# Patient Record
Sex: Female | Born: 1945 | Race: White | Hispanic: No | Marital: Married | Smoking: Former smoker
Health system: Southern US, Community
[De-identification: ages and names within clinical notes are randomized; demographics above are authoritative.]

## PROBLEM LIST (undated history)

## (undated) DIAGNOSIS — C801 Malignant (primary) neoplasm, unspecified: Secondary | ICD-10-CM

## (undated) DIAGNOSIS — M1711 Unilateral primary osteoarthritis, right knee: Secondary | ICD-10-CM

## (undated) DIAGNOSIS — T4145XA Adverse effect of unspecified anesthetic, initial encounter: Secondary | ICD-10-CM

## (undated) DIAGNOSIS — F419 Anxiety disorder, unspecified: Secondary | ICD-10-CM

## (undated) DIAGNOSIS — K219 Gastro-esophageal reflux disease without esophagitis: Secondary | ICD-10-CM

## (undated) DIAGNOSIS — M1990 Unspecified osteoarthritis, unspecified site: Secondary | ICD-10-CM

## (undated) DIAGNOSIS — Z22321 Carrier or suspected carrier of Methicillin susceptible Staphylococcus aureus: Secondary | ICD-10-CM

## (undated) DIAGNOSIS — IMO0001 Reserved for inherently not codable concepts without codable children: Secondary | ICD-10-CM

## (undated) DIAGNOSIS — I10 Essential (primary) hypertension: Secondary | ICD-10-CM

## (undated) DIAGNOSIS — D72829 Elevated white blood cell count, unspecified: Secondary | ICD-10-CM

## (undated) DIAGNOSIS — T8859XA Other complications of anesthesia, initial encounter: Secondary | ICD-10-CM

## (undated) HISTORY — PX: CHOLECYSTECTOMY: SHX55

## (undated) HISTORY — PX: BREAST SURGERY: SHX581

---

## 2014-01-16 ENCOUNTER — Emergency Department (HOSPITAL_BASED_OUTPATIENT_CLINIC_OR_DEPARTMENT_OTHER)
Admission: EM | Admit: 2014-01-16 | Discharge: 2014-01-16 | Disposition: A | Discharge status: Home / Self Care | Payer: Medicare Other | Attending: Emergency Medicine | Admitting: Emergency Medicine

## 2014-01-16 ENCOUNTER — Emergency Department (HOSPITAL_BASED_OUTPATIENT_CLINIC_OR_DEPARTMENT_OTHER): Payer: Medicare Other

## 2014-01-16 ENCOUNTER — Encounter (HOSPITAL_BASED_OUTPATIENT_CLINIC_OR_DEPARTMENT_OTHER): Payer: Self-pay | Admitting: Emergency Medicine

## 2014-01-16 DIAGNOSIS — Y929 Unspecified place or not applicable: Secondary | ICD-10-CM | POA: Diagnosis not present

## 2014-01-16 DIAGNOSIS — F411 Generalized anxiety disorder: Secondary | ICD-10-CM | POA: Insufficient documentation

## 2014-01-16 DIAGNOSIS — S46909A Unspecified injury of unspecified muscle, fascia and tendon at shoulder and upper arm level, unspecified arm, initial encounter: Secondary | ICD-10-CM | POA: Insufficient documentation

## 2014-01-16 DIAGNOSIS — Z79899 Other long term (current) drug therapy: Secondary | ICD-10-CM | POA: Insufficient documentation

## 2014-01-16 DIAGNOSIS — S4980XA Other specified injuries of shoulder and upper arm, unspecified arm, initial encounter: Secondary | ICD-10-CM | POA: Insufficient documentation

## 2014-01-16 DIAGNOSIS — W010XXA Fall on same level from slipping, tripping and stumbling without subsequent striking against object, initial encounter: Secondary | ICD-10-CM | POA: Diagnosis not present

## 2014-01-16 DIAGNOSIS — W1809XA Striking against other object with subsequent fall, initial encounter: Secondary | ICD-10-CM | POA: Diagnosis not present

## 2014-01-16 DIAGNOSIS — K219 Gastro-esophageal reflux disease without esophagitis: Secondary | ICD-10-CM | POA: Insufficient documentation

## 2014-01-16 DIAGNOSIS — Z880 Allergy status to penicillin: Secondary | ICD-10-CM | POA: Diagnosis not present

## 2014-01-16 DIAGNOSIS — S43004A Unspecified dislocation of right shoulder joint, initial encounter: Secondary | ICD-10-CM

## 2014-01-16 DIAGNOSIS — Y9389 Activity, other specified: Secondary | ICD-10-CM | POA: Insufficient documentation

## 2014-01-16 DIAGNOSIS — S43006A Unspecified dislocation of unspecified shoulder joint, initial encounter: Secondary | ICD-10-CM | POA: Insufficient documentation

## 2014-01-16 HISTORY — DX: Anxiety disorder, unspecified: F41.9

## 2014-01-16 HISTORY — DX: Gastro-esophageal reflux disease without esophagitis: K21.9

## 2014-01-16 HISTORY — DX: Reserved for inherently not codable concepts without codable children: IMO0001

## 2014-01-16 MED ORDER — HYDROCODONE-ACETAMINOPHEN 5-325 MG PO TABS: 1.0000 | ORAL_TABLET | ORAL | Status: DC | PRN

## 2014-01-16 MED ORDER — ONDANSETRON HCL 4 MG/2ML IJ SOLN
INTRAMUSCULAR | Status: AC
  Administered 2014-01-16: 4 mg via INTRAVENOUS
  Filled 2014-01-16: qty 2

## 2014-01-16 MED ORDER — ONDANSETRON HCL 4 MG/2ML IJ SOLN
4.0000 mg | Freq: Once | INTRAMUSCULAR | Status: AC
  Administered 2014-01-16: 4 mg via INTRAVENOUS

## 2014-01-16 MED ORDER — HYDROMORPHONE HCL 1 MG/ML IJ SOLN
1.0000 mg | Freq: Once | INTRAMUSCULAR | Status: AC
  Administered 2014-01-16: 1 mg via INTRAVENOUS

## 2014-01-16 MED ORDER — HYDROMORPHONE HCL 1 MG/ML IJ SOLN
INTRAMUSCULAR | Status: AC
  Administered 2014-01-16: 1 mg via INTRAVENOUS
  Filled 2014-01-16: qty 1

## 2014-01-16 NOTE — ED Notes (Signed)
Portable shoulder xray for post reduction films in progress.

## 2014-01-16 NOTE — ED Notes (Signed)
Dr. Stark Jock assisted with applying traction to reduce shoulder by this rn. Post reduction films ordered.

## 2014-01-16 NOTE — ED Notes (Signed)
Pt amb to triage with slow, steady gait, right arm propped up on pillow and ice pack in place, pt reports trip and fall just pta landed on her right shoulder, pt has no movement of right shoulder due to pain. + radial pulse, cap refill < 3 sec, able to move fingers. Pt denies hitting head or loc, denies any other c/o.

## 2014-01-16 NOTE — Discharge Instructions (Signed)
Wear arm sling for the next several days for support, then slowly return to normal activity.  Ice for 20 minutes every 2 hours while awake for the next 2 days.  Return to the ER if you experience any additional problems.     Shoulder Dislocation Your shoulder is made up of three bones: the collar bone (clavicle); the shoulder blade (scapula), which includes the socket (glenoid cavity); and the upper arm bone (humerus). Your shoulder joint is the place where these bones meet. Strong, fibrous tissues hold these bones together (ligaments). Muscles and strong, fibrous tissues that connect the muscles to these bones (tendons) allow your arm to move through this joint. The range of motion of your shoulder joint is more extensive than most of your other joints, and the glenoid cavity is very shallow. That is the reason that your shoulder joint is one of the most unstable joints in your body. It is far more prone to dislocation than your other joints. Shoulder dislocation is when your humerus is forced out of your shoulder joint. CAUSES Shoulder dislocation is caused by a forceful impact on your shoulder. This impact usually is from an injury, such as a sports injury or a fall. SYMPTOMS Symptoms of shoulder dislocation include:  Deformity of your shoulder.  Intense pain.  Inability to move your shoulder joint.  Numbness, weakness, or tingling around your shoulder joint (your neck or down your arm).  Bruising or swelling around your shoulder. DIAGNOSIS In order to diagnose a dislocated shoulder, your caregiver will perform a physical exam. Your caregiver also may have an X-ray exam done to see if you have any broken bones. Magnetic resonance imaging (MRI) is a procedure that sometimes is done to help your caregiver see any damage to the soft tissues around your shoulder, particularly your rotator cuff tendons. Additionally, your caregiver also may have electromyography done to measure the electrical  discharges produced in your muscles if you have signs or symptoms of nerve damage. TREATMENT A shoulder dislocation is treated by placing the humerus back in the joint (reduction). Your caregiver does this either manually (closed reduction), by moving your humerus back into the joint through manipulation, or through surgery (open reduction). When your humerus is back in place, severe pain should improve almost immediately. You also may need to have surgery if you have a weak shoulder joint or ligaments, and you have recurring shoulder dislocations, despite rehabilitation. In rare cases, surgery is necessary if your nerves or blood vessels are damaged during the dislocation. After your reduction, your arm will be placed in a shoulder immobilizer or sling to keep it from moving. Your caregiver will have you wear your shoulder immobilizer or sling for 3 days to 3 weeks, depending on how serious your dislocation is. When your shoulder immobilizer or sling is removed, your caregiver may prescribe physical therapy to help improve the range of motion in your shoulder joint. HOME CARE INSTRUCTIONS  The following measures can help to reduce pain and speed up the healing process:  Rest your injured joint. Do not move it. Avoid activities similar to the one that caused your injury.  Apply ice to your injured joint for the first day or two after your reduction or as directed by your caregiver. Applying ice helps to reduce inflammation and pain.  Put ice in a plastic bag.  Place a towel between your skin and the bag.  Leave the ice on for 15-20 minutes at a time, every 2 hours while you  are awake.  Exercise your hand by squeezing a soft ball. This helps to eliminate stiffness and swelling in your hand and wrist.  Take over-the-counter or prescription medicine for pain or discomfort as told by your caregiver. SEEK IMMEDIATE MEDICAL CARE IF:   Your shoulder immobilizer or sling becomes damaged.  Your pain  becomes worse rather than better.  You lose feeling in your arm or hand, or they become white and cold. MAKE SURE YOU:   Understand these instructions.  Will watch your condition.  Will get help right away if you are not doing well or get worse. Document Released: 01/07/2001 Document Revised: 08/29/2013 Document Reviewed: 02/02/2011 [facility] Patient Information 2015 [place], [place]. This information is not intended to replace advice given to you by your health care provider. Make sure you discuss any questions you have with your health care provider.

## 2014-01-16 NOTE — ED Notes (Signed)
Order entered for xray, radiology phoned to request priority of pt due to her pain. Pt assisted to position of comfort, resting her arm on bedside table with pillow. Pt daughter is at bedside and is aware of plan of care.

## 2014-01-16 NOTE — ED Provider Notes (Signed)
CSN: [id number]     Arrival date & time 01/16/14  1456 History  This chart was scribed for Veryl Speak, MD by Ladene Artist, ED Scribe. The patient was seen in room MH10/MH10. Patient's care was started at 3:49 PM.   No chief complaint on file.  Patient is a 68 y.o. female presenting with shoulder injury. The history is provided by the patient. No language interpreter was used.  Shoulder Injury This is a new problem. The current episode started 1 to 2 hours ago. The problem occurs constantly.   HPI Comments: Jennifer Tran is a 68 y.o. female who presents to the Emergency Department complaining of trip and fall that occurred around 1:30 PM today. Pt states that her arm extended and she hit her R shoulder on the floor upon falling. Pt reports associated R shoulder pain. She reports h/o shoulder pain; no surgeries.   Past Medical History  Diagnosis Date  . Reflux   . Anxiety    History reviewed. No pertinent past surgical history. History reviewed. No pertinent family history. History  Substance Use Topics  . Smoking status: Never Smoker   . Smokeless tobacco: Not on file  . Alcohol Use: Not on file   OB History   Grav Para Term Preterm Abortions TAB SAB Ect Mult Living                 Review of Systems  Musculoskeletal: Positive for arthralgias.  All other systems reviewed and are negative.  Allergies  Ivp dye and Penicillins  Home Medications   Prior to Admission medications   Medication Sig Start Date End Date Taking? Authorizing Provider  ALPRAZolam Duanne Moron) 0.25 MG tablet Take 0.25 mg by mouth at bedtime as needed for anxiety.   Yes Historical Provider, MD  omeprazole (PRILOSEC) 20 MG capsule Take 20 mg by mouth daily.   Yes Historical Provider, MD   Triage Vitals: BP 162/93  Pulse 86  Resp 22  SpO2 100% Physical Exam  Nursing note and vitals reviewed. Constitutional: She is oriented to person, place, and time. She appears well-developed and well-nourished. No  distress.  HENT:  Head: Normocephalic and atraumatic.  Eyes: Conjunctivae and EOM are normal.  Neck: Neck supple. No tracheal deviation present.  Cardiovascular: Normal rate.   Pulmonary/Chest: Effort normal. No respiratory distress.  Musculoskeletal: Normal range of motion.  R shoulder with obvious glenohumeral dislocation. The ulnar and radial pulses are easily palpable. She is able to flex and extend all fingers and sensation is intact to the entire hand.   Neurological: She is alert and oriented to person, place, and time.  Skin: Skin is warm and dry.  Psychiatric: She has a normal mood and affect. Her behavior is normal.   ED Course  Reduction of dislocation Date/Time: 01/16/2014 3:33 PM Performed by: Veryl Speak Authorized by: Veryl Speak Consent: Verbal consent obtained. written consent obtained. Risks and benefits: risks, benefits and alternatives were discussed Consent given by: patient Patient understanding: patient states understanding of the procedure being performed Patient consent: the patient's understanding of the procedure matches consent given Procedure consent: procedure consent matches procedure scheduled Relevant documents: relevant documents present and verified Test results: test results available and properly labeled Site marked: the operative site was marked Imaging studies: imaging studies available Patient identity confirmed: verbally with patient Local anesthesia used: no Patient sedated: no Patient tolerance: Patient tolerated the procedure well with no immediate complications. Comments: Shoulder easily reduced with traction countertraction technique. Patient tolerated the procedure  well.   (including critical care time) DIAGNOSTIC STUDIES: Oxygen Saturation is 100% on RA, normal by my interpretation.    COORDINATION OF CARE: 4:01 PM-Discussed treatment plan which includes XR with pt at bedside and pt agreed to plan.   Labs Review Labs  Reviewed - No data to display  Imaging Review Dg Shoulder Right  01/16/2014   CLINICAL DATA:  Status post fall onto the right shoulder now with pain and limited range of motion  EXAM: RIGHT SHOULDER - 2+ VIEW  COMPARISON:  None.  FINDINGS: The patient has sustained inferior and presumably anterior dislocation of the humeral head with respect to the bony glenoid. No acute fracture is demonstrated. The [facility] joint is unremarkable. The observed portions of the right clavicle and upper right ribs are normal.  IMPRESSION: The patient has sustained acute inferior and presumably anterior dislocation of the right shoulder.   Electronically Signed   By: David  Martinique   On: 01/16/2014 16:13   Dg Shoulder Right Port  01/16/2014   CLINICAL DATA:  Post reduction images  EXAM: PORTABLE RIGHT SHOULDER - 2+ VIEW  COMPARISON:  Pre reduction images of today's date at 1536 hr  FINDINGS: The glenohumeral joint appears normally positioned. There is no evidence of an acute displaced fracture. Mild mineralization irregularity of the greater tuberosity may reflect impaction injury. The [facility] joint exhibits mild degenerative change. The clavicle and observed portions of the upper right ribs are normal.  IMPRESSION: The patient has undergone successful relocation of the previously dislocated right shoulder. No acute fracture is demonstrated.   Electronically Signed   By: David  Martinique   On: 01/16/2014 16:32    EKG Interpretation None      MDM   Final diagnoses:  None    Patient presents after a fall in which she injured her right shoulder. X-rays reveal a glenohumeral dislocation. This was reduced with traction/countertraction. Reduction was confirmed with repeat x-ray and she is neurovascularly intact pre-and post reduction. She will be placed in a sling and advised to followup as needed.  I personally performed the services described in this documentation, which was scribed in my presence. The recorded information has been  reviewed and is accurate.    Veryl Speak, MD 01/17/14 [phone]

## 2014-01-21 ENCOUNTER — Encounter (HOSPITAL_BASED_OUTPATIENT_CLINIC_OR_DEPARTMENT_OTHER): Payer: Self-pay | Admitting: Emergency Medicine

## 2014-01-21 ENCOUNTER — Emergency Department (HOSPITAL_BASED_OUTPATIENT_CLINIC_OR_DEPARTMENT_OTHER)
Admission: EM | Admit: 2014-01-21 | Discharge: 2014-01-21 | Disposition: A | Discharge status: Home / Self Care | Payer: Medicare Other | Attending: Emergency Medicine | Admitting: Emergency Medicine

## 2014-01-21 DIAGNOSIS — Y9389 Activity, other specified: Secondary | ICD-10-CM | POA: Insufficient documentation

## 2014-01-21 DIAGNOSIS — Z880 Allergy status to penicillin: Secondary | ICD-10-CM | POA: Diagnosis not present

## 2014-01-21 DIAGNOSIS — X500XXA Overexertion from strenuous movement or load, initial encounter: Secondary | ICD-10-CM | POA: Insufficient documentation

## 2014-01-21 DIAGNOSIS — F411 Generalized anxiety disorder: Secondary | ICD-10-CM | POA: Diagnosis not present

## 2014-01-21 DIAGNOSIS — S46909A Unspecified injury of unspecified muscle, fascia and tendon at shoulder and upper arm level, unspecified arm, initial encounter: Secondary | ICD-10-CM | POA: Insufficient documentation

## 2014-01-21 DIAGNOSIS — M25519 Pain in unspecified shoulder: Secondary | ICD-10-CM | POA: Diagnosis present

## 2014-01-21 DIAGNOSIS — Y929 Unspecified place or not applicable: Secondary | ICD-10-CM | POA: Diagnosis not present

## 2014-01-21 DIAGNOSIS — S4980XA Other specified injuries of shoulder and upper arm, unspecified arm, initial encounter: Secondary | ICD-10-CM | POA: Diagnosis not present

## 2014-01-21 DIAGNOSIS — Z79899 Other long term (current) drug therapy: Secondary | ICD-10-CM | POA: Insufficient documentation

## 2014-01-21 DIAGNOSIS — K219 Gastro-esophageal reflux disease without esophagitis: Secondary | ICD-10-CM | POA: Insufficient documentation

## 2014-01-21 DIAGNOSIS — M24411 Recurrent dislocation, right shoulder: Secondary | ICD-10-CM

## 2014-01-21 DIAGNOSIS — S43006A Unspecified dislocation of unspecified shoulder joint, initial encounter: Secondary | ICD-10-CM | POA: Insufficient documentation

## 2014-01-21 NOTE — ED Provider Notes (Signed)
CSN: [id number]     Arrival date & time 01/21/14  1442 History   First MD Initiated Contact with Patient 01/21/14 1522     Chief Complaint  Patient presents with  . Shoulder Injury     (Consider location/radiation/quality/duration/timing/severity/associated sxs/prior Treatment) HPI 1pm after shower was putting Calamine lotion on and shoulder popped out. Was dislocated Monday due to fall without other injury. Was relocated without sedation per patient. Has no other complaints than severe pain in shoulder and difficulty to move.  Past Medical History  Diagnosis Date  . Reflux   . Anxiety    History reviewed. No pertinent past surgical history. History reviewed. No pertinent family history. History  Substance Use Topics  . Smoking status: Never Smoker   . Smokeless tobacco: Not on file  . Alcohol Use: Not on file   OB History   Grav Para Term Preterm Abortions TAB SAB Ect Mult Living                 Review of Systems 10 Systems reviewed and are negative for acute change except as noted in the HPI.   Allergies  Ivp dye and Penicillins  Home Medications   Prior to Admission medications   Medication Sig Start Date End Date Taking? Authorizing Provider  ALPRAZolam Duanne Moron) 0.25 MG tablet Take 0.25 mg by mouth at bedtime as needed for anxiety.    Historical Provider, MD  HYDROcodone-acetaminophen (NORCO) 5-325 MG per tablet Take 1-2 tablets by mouth every 4 (four) hours as needed. 01/16/14   Veryl Speak, MD  omeprazole (PRILOSEC) 20 MG capsule Take 20 mg by mouth daily.    Historical Provider, MD   BP 178/92  Pulse 73  Temp(Src) 98.2 F (36.8 C) (Oral)  Resp 20  SpO2 98% Physical Exam  Constitutional: She is oriented to person, place, and time. She appears well-developed and well-nourished.  HENT:  Head: Normocephalic and atraumatic.  Eyes: EOM are normal.  Neck: Neck supple.  Cardiovascular: Normal rate, regular rhythm and normal heart sounds.   Pulmonary/Chest:  Effort normal and breath sounds normal.  Musculoskeletal:  Rt shoulder dropped. Pain with any movement. Radial pulse 2. Grip intact. Lower extremities normal.  Neurological: She is alert and oriented to person, place, and time.  Skin: Skin is warm and dry.  Psychiatric: She has a normal mood and affect.    ED Course  Procedures (including critical care time) Labs Review Labs Reviewed - No data to display  Imaging Review No results found.   EKG Interpretation None     Procedure: Shoulder relocation. Without sedation able to relocated with downward traction and external rotation. "clunk" easily felt. Patient had immediate relief of pain. Post exam. 2 radial pulse, good grip strength, no subjective paresthesia post reduction. Sling applied by nursing. MDM   Final diagnoses:  Shoulder dislocation, recurrent, right   Patient counselled for orthopedic follow up due to significant rotator cuff laxity. Doesn't need pain medication or refill per patient. Will follow up with Dr. Laurance Flatten at [place], MD 01/21/14 [phone]

## 2014-01-21 NOTE — ED Notes (Signed)
Pfeiffer MD at bedside- shoulder reduced by EDPClaiborne Billings EMT at bedside to apply immobilizer

## 2014-01-21 NOTE — ED Notes (Signed)
Pt reports here for shoulder dislocation on Monday.  Reports that she raised her arm above her head today and dislocated it again.

## 2014-01-21 NOTE — Discharge Instructions (Signed)
Shoulder Dislocation  Your shoulder is made up of three bones: the collar bone (clavicle); the shoulder blade (scapula), which includes the socket (glenoid cavity); and the upper arm bone (humerus). Your shoulder joint is the place where these bones meet. Strong, fibrous tissues hold these bones together (ligaments). Muscles and strong, fibrous tissues that connect the muscles to these bones (tendons) allow your arm to move through this joint. The range of motion of your shoulder joint is more extensive than most of your other joints, and the glenoid cavity is very shallow. That is the reason that your shoulder joint is one of the most unstable joints in your body. It is far more prone to dislocation than your other joints. Shoulder dislocation is when your humerus is forced out of your shoulder joint.  CAUSES  Shoulder dislocation is caused by a forceful impact on your shoulder. This impact usually is from an injury, such as a sports injury or a fall.  SYMPTOMS  Symptoms of shoulder dislocation include:  · Deformity of your shoulder.  · Intense pain.  · Inability to move your shoulder joint.  · Numbness, weakness, or tingling around your shoulder joint (your neck or down your arm).  · Bruising or swelling around your shoulder.  DIAGNOSIS  In order to diagnose a dislocated shoulder, your caregiver will perform a physical exam. Your caregiver also may have an X-ray exam done to see if you have any broken bones. Magnetic resonance imaging (MRI) is a procedure that sometimes is done to help your caregiver see any damage to the soft tissues around your shoulder, particularly your rotator cuff tendons. Additionally, your caregiver also may have electromyography done to measure the electrical discharges produced in your muscles if you have signs or symptoms of nerve damage.  TREATMENT  A shoulder dislocation is treated by placing the humerus back in the joint (reduction). Your caregiver does this either manually (closed  reduction), by moving your humerus back into the joint through manipulation, or through surgery (open reduction). When your humerus is back in place, severe pain should improve almost immediately.  You also may need to have surgery if you have a weak shoulder joint or ligaments, and you have recurring shoulder dislocations, despite rehabilitation. In rare cases, surgery is necessary if your nerves or blood vessels are damaged during the dislocation.  After your reduction, your arm will be placed in a shoulder immobilizer or sling to keep it from moving. Your caregiver will have you wear your shoulder immobilizer or sling for 3 days to 3 weeks, depending on how serious your dislocation is. When your shoulder immobilizer or sling is removed, your caregiver may prescribe physical therapy to help improve the range of motion in your shoulder joint.  HOME CARE INSTRUCTIONS   The following measures can help to reduce pain and speed up the healing process:  · Rest your injured joint. Do not move it. Avoid activities similar to the one that caused your injury.  · Apply ice to your injured joint for the first day or two after your reduction or as directed by your caregiver. Applying ice helps to reduce inflammation and pain.  ¨ Put ice in a plastic bag.  ¨ Place a towel between your skin and the bag.  ¨ Leave the ice on for 15-20 minutes at a time, every 2 hours while you are awake.  · Exercise your hand by squeezing a soft ball. This helps to eliminate stiffness and swelling in your hand and   wrist.  · Take over-the-counter or prescription medicine for pain or discomfort as told by your caregiver.  SEEK IMMEDIATE MEDICAL CARE IF:   · Your shoulder immobilizer or sling becomes damaged.  · Your pain becomes worse rather than better.  · You lose feeling in your arm or hand, or they become white and cold.  MAKE SURE YOU:   · Understand these instructions.  · Will watch your condition.  · Will get help right away if you are not  doing well or get worse.  Document Released: 01/07/2001 Document Revised: 08/29/2013 Document Reviewed: 02/02/2011  [employer]® Patient Information ©2015 [employer]. This information is not intended to replace advice given to you by your health care provider. Make sure you discuss any questions you have with your health care provider.

## 2015-05-22 DIAGNOSIS — N281 Cyst of kidney, acquired: Secondary | ICD-10-CM | POA: Diagnosis not present

## 2015-06-27 ENCOUNTER — Encounter: Payer: Self-pay | Admitting: Emergency Medicine

## 2015-06-27 ENCOUNTER — Emergency Department (INDEPENDENT_AMBULATORY_CARE_PROVIDER_SITE_OTHER)
Admission: EM | Admit: 2015-06-27 | Discharge: 2015-06-27 | Disposition: A | Discharge status: Home / Self Care | Payer: Medicare Other | Source: Home / Self Care | Attending: Family Medicine | Admitting: Family Medicine

## 2015-06-27 DIAGNOSIS — R6889 Other general symptoms and signs: Secondary | ICD-10-CM

## 2015-06-27 DIAGNOSIS — Z20828 Contact with and (suspected) exposure to other viral communicable diseases: Secondary | ICD-10-CM | POA: Diagnosis not present

## 2015-06-27 MED ORDER — BENZONATATE 100 MG PO CAPS: 100.0000 mg | ORAL_CAPSULE | Freq: Three times a day (TID) | ORAL | Status: DC

## 2015-06-27 MED ORDER — OSELTAMIVIR PHOSPHATE 75 MG PO CAPS: 75.0000 mg | ORAL_CAPSULE | Freq: Two times a day (BID) | ORAL | Status: DC

## 2015-06-27 NOTE — ED Provider Notes (Signed)
CSN: [id number]     Arrival date & time 06/27/15  1131 History   First MD Initiated Contact with Patient 06/27/15 1149     Chief Complaint  Patient presents with  . Influenza   (Consider location/radiation/quality/duration/timing/severity/associated sxs/prior Treatment) HPI  The pt is a 70yo female presenting to [facility] with c/o sudden onset body aches, chills, fever Tmax 102.7, generalized headache, mild to moderately intermittent non-productive cough for 3 days.  Pt's husband tested Positive for Influenza A the other day.  She has been using OTC acetaminophen and ibuprofen for fever and pain but only temporary relief. Denies n/v/d. Denies chest pain or SOB. No hx of asthma.   Past Medical History  Diagnosis Date  . Reflux   . Anxiety    History reviewed. No pertinent past surgical history. No family history on file. Social History  Substance Use Topics  . Smoking status: Former Research scientist (life sciences)  . Smokeless tobacco: None  . Alcohol Use: No   OB History    No data available     Review of Systems  Constitutional: Positive for fever, chills and fatigue.  HENT: Positive for congestion, rhinorrhea, sneezing and sore throat. Negative for ear pain, trouble swallowing and voice change.   Respiratory: Positive for cough. Negative for shortness of breath.   Cardiovascular: Negative for chest pain and palpitations.  Gastrointestinal: Negative for nausea, vomiting, abdominal pain and diarrhea.  Musculoskeletal: Positive for myalgias and arthralgias. Negative for back pain.       Body aches  Skin: Negative for rash.  Neurological: Positive for dizziness and headaches. Negative for light-headedness.  All other systems reviewed and are negative.   Allergies  Ivp dye and Penicillins  Home Medications   Prior to Admission medications   Medication Sig Start Date End Date Taking? Authorizing Provider  ALPRAZolam Duanne Moron) 0.25 MG tablet Take 0.25 mg by mouth at bedtime as needed for anxiety.     Historical Provider, MD  benzonatate (TESSALON) 100 MG capsule Take 1-2 capsules (100-200 mg total) by mouth every 8 (eight) hours. 06/27/15   Noland Fordyce, PA-C  HYDROcodone-acetaminophen (NORCO) 5-325 MG per tablet Take 1-2 tablets by mouth every 4 (four) hours as needed. 01/16/14   Veryl Speak, MD  omeprazole (PRILOSEC) 20 MG capsule Take 20 mg by mouth daily.    Historical Provider, MD  oseltamivir (TAMIFLU) 75 MG capsule Take 1 capsule (75 mg total) by mouth every 12 (twelve) hours. 06/27/15   Noland Fordyce, PA-C   Meds Ordered and Administered this Visit  Medications - No data to display  BP 117/84 mmHg  Pulse 98  Temp(Src) 98.6 F (37 C) (Oral)  Ht 5\' 4"  (1.626 m)  Wt 189 lb (85.73 kg)  BMI 32.43 kg/m2  SpO2 93% No data found.   Physical Exam  Constitutional: She appears well-developed and well-nourished. No distress.  HENT:  Head: Normocephalic and atraumatic.  Right Ear: Tympanic membrane normal.  Left Ear: Tympanic membrane normal.  Nose: Rhinorrhea present.  Mouth/Throat: Uvula is midline, oropharynx is clear and moist and mucous membranes are normal.  Eyes: Conjunctivae are normal. No scleral icterus.  Neck: Normal range of motion. Neck supple.  Cardiovascular: Normal rate, regular rhythm and normal heart sounds.   Pulmonary/Chest: Effort normal and breath sounds normal. No respiratory distress. She has no wheezes. She has no rales.  Dry intermittent cough. Lungs: CTAB, no wheeze or rhonchi. No respiratory distress.  Abdominal: Soft. She exhibits no distension. There is no tenderness.  Musculoskeletal: Normal range  of motion.  Neurological: She is alert.  Skin: Skin is warm and dry. She is not diaphoretic.  Nursing note and vitals reviewed.   ED Course  Procedures (including critical care time)  Labs Review Labs Reviewed - No data to display  Imaging Review No results found.    MDM   1. Flu-like symptoms   2. Exposure to the flu    Pt c/u sudden onset  flu-like symptoms 3 days ago. Husband tested positive for Influenza A.  Will treat empirically for Influenza  Rx: Tamiflu and Tessalon  Advised pt to use acetaminophen and ibuprofen as needed for fever and pain. Encouraged rest and fluids. F/u with PCP in 7-10 days if not improving, sooner if worsening. Pt verbalized understanding and agreement with tx plan.     Noland Fordyce, PA-C 06/27/15 1201

## 2015-06-27 NOTE — ED Notes (Signed)
Chills, fever, 102.7, headache, dizzy, cough x 3 days. Husband tested positive for flu

## 2015-06-27 NOTE — Discharge Instructions (Signed)
You may take 400-600mg  Ibuprofen (Motrin) every 6-8 hours for fever and pain  Alternate with Tylenol  You may take 500mg  Tylenol every 4-6 hours as needed for fever and pain  Follow-up with your primary care provider next week for recheck of symptoms if not improving.  Be sure to drink plenty of fluids and rest, at least 8hrs of sleep a night, preferably more while you are sick. Return urgent care or go to closest ER if you cannot keep down fluids/signs of dehydration, fever not reducing with Tylenol, difficulty breathing/wheezing, stiff neck, worsening condition, or other concerns (see below)  Please take Tamiflu as prescribed and be sure to complete entire course even if you start to feel better to ensure infection does not come back.

## 2015-06-30 ENCOUNTER — Telehealth: Payer: Self-pay | Admitting: Emergency Medicine

## 2015-10-09 DIAGNOSIS — I8390 Asymptomatic varicose veins of unspecified lower extremity: Secondary | ICD-10-CM | POA: Diagnosis not present

## 2015-10-09 DIAGNOSIS — F419 Anxiety disorder, unspecified: Secondary | ICD-10-CM | POA: Diagnosis not present

## 2015-10-09 DIAGNOSIS — I10 Essential (primary) hypertension: Secondary | ICD-10-CM | POA: Diagnosis not present

## 2015-10-09 DIAGNOSIS — M170 Bilateral primary osteoarthritis of knee: Secondary | ICD-10-CM | POA: Diagnosis not present

## 2015-10-09 DIAGNOSIS — Z87891 Personal history of nicotine dependence: Secondary | ICD-10-CM | POA: Diagnosis not present

## 2015-10-09 DIAGNOSIS — Z0000 Encounter for general adult medical examination without abnormal findings: Secondary | ICD-10-CM | POA: Diagnosis not present

## 2015-10-09 DIAGNOSIS — Z85820 Personal history of malignant melanoma of skin: Secondary | ICD-10-CM | POA: Diagnosis not present

## 2015-10-09 DIAGNOSIS — Z86010 Personal history of colonic polyps: Secondary | ICD-10-CM | POA: Diagnosis not present

## 2015-10-09 DIAGNOSIS — M1711 Unilateral primary osteoarthritis, right knee: Secondary | ICD-10-CM | POA: Diagnosis not present

## 2015-10-11 DIAGNOSIS — Z85820 Personal history of malignant melanoma of skin: Secondary | ICD-10-CM | POA: Diagnosis not present

## 2015-10-11 DIAGNOSIS — Z08 Encounter for follow-up examination after completed treatment for malignant neoplasm: Secondary | ICD-10-CM | POA: Diagnosis not present

## 2015-10-11 DIAGNOSIS — L570 Actinic keratosis: Secondary | ICD-10-CM | POA: Diagnosis not present

## 2016-04-08 DIAGNOSIS — Z23 Encounter for immunization: Secondary | ICD-10-CM | POA: Diagnosis not present

## 2016-04-08 DIAGNOSIS — Z1231 Encounter for screening mammogram for malignant neoplasm of breast: Secondary | ICD-10-CM | POA: Diagnosis not present

## 2016-07-27 IMAGING — CR DG SHOULDER 2+V*R*
2 series · 2 of 2 positions shown · non-contrast
Comparison: None.

CLINICAL DATA: Status post fall onto the right shoulder now with
pain and limited range of motion

EXAM:
RIGHT SHOULDER - 2+ VIEW

[view not recorded (1 of 2)]
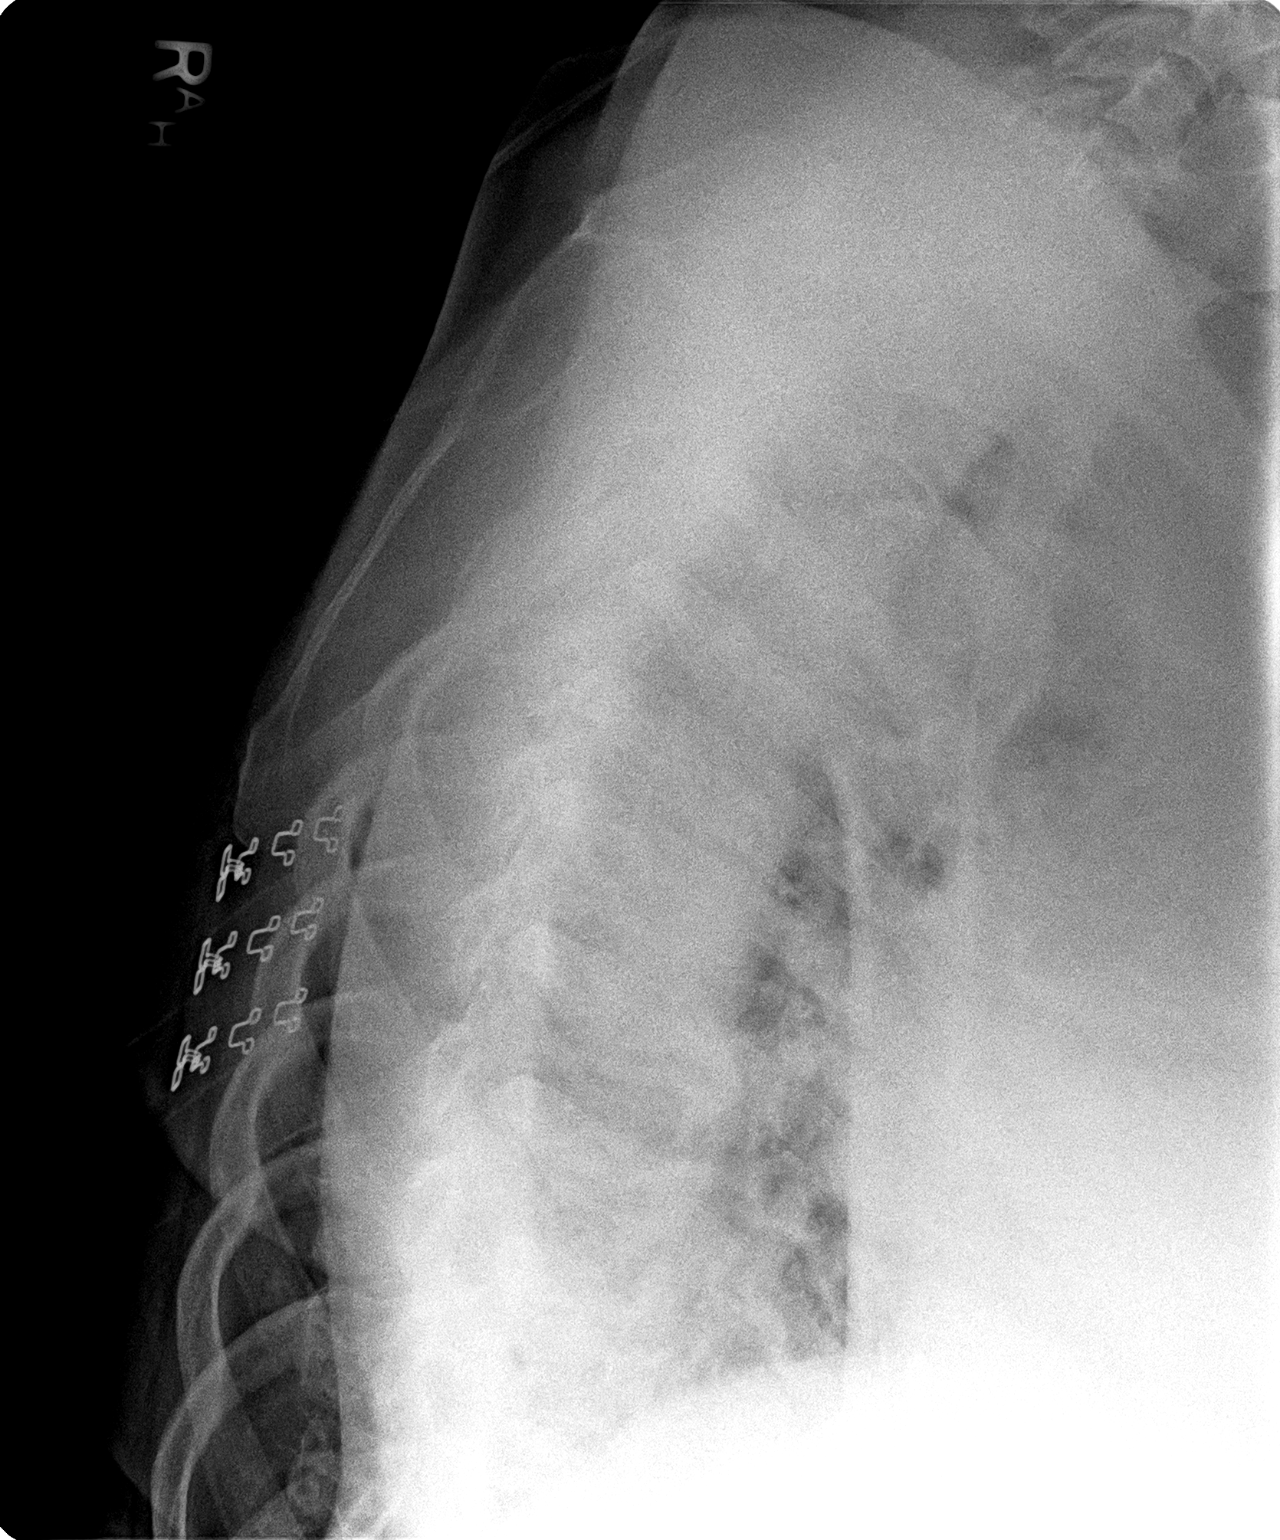

[view not recorded (2 of 2)]
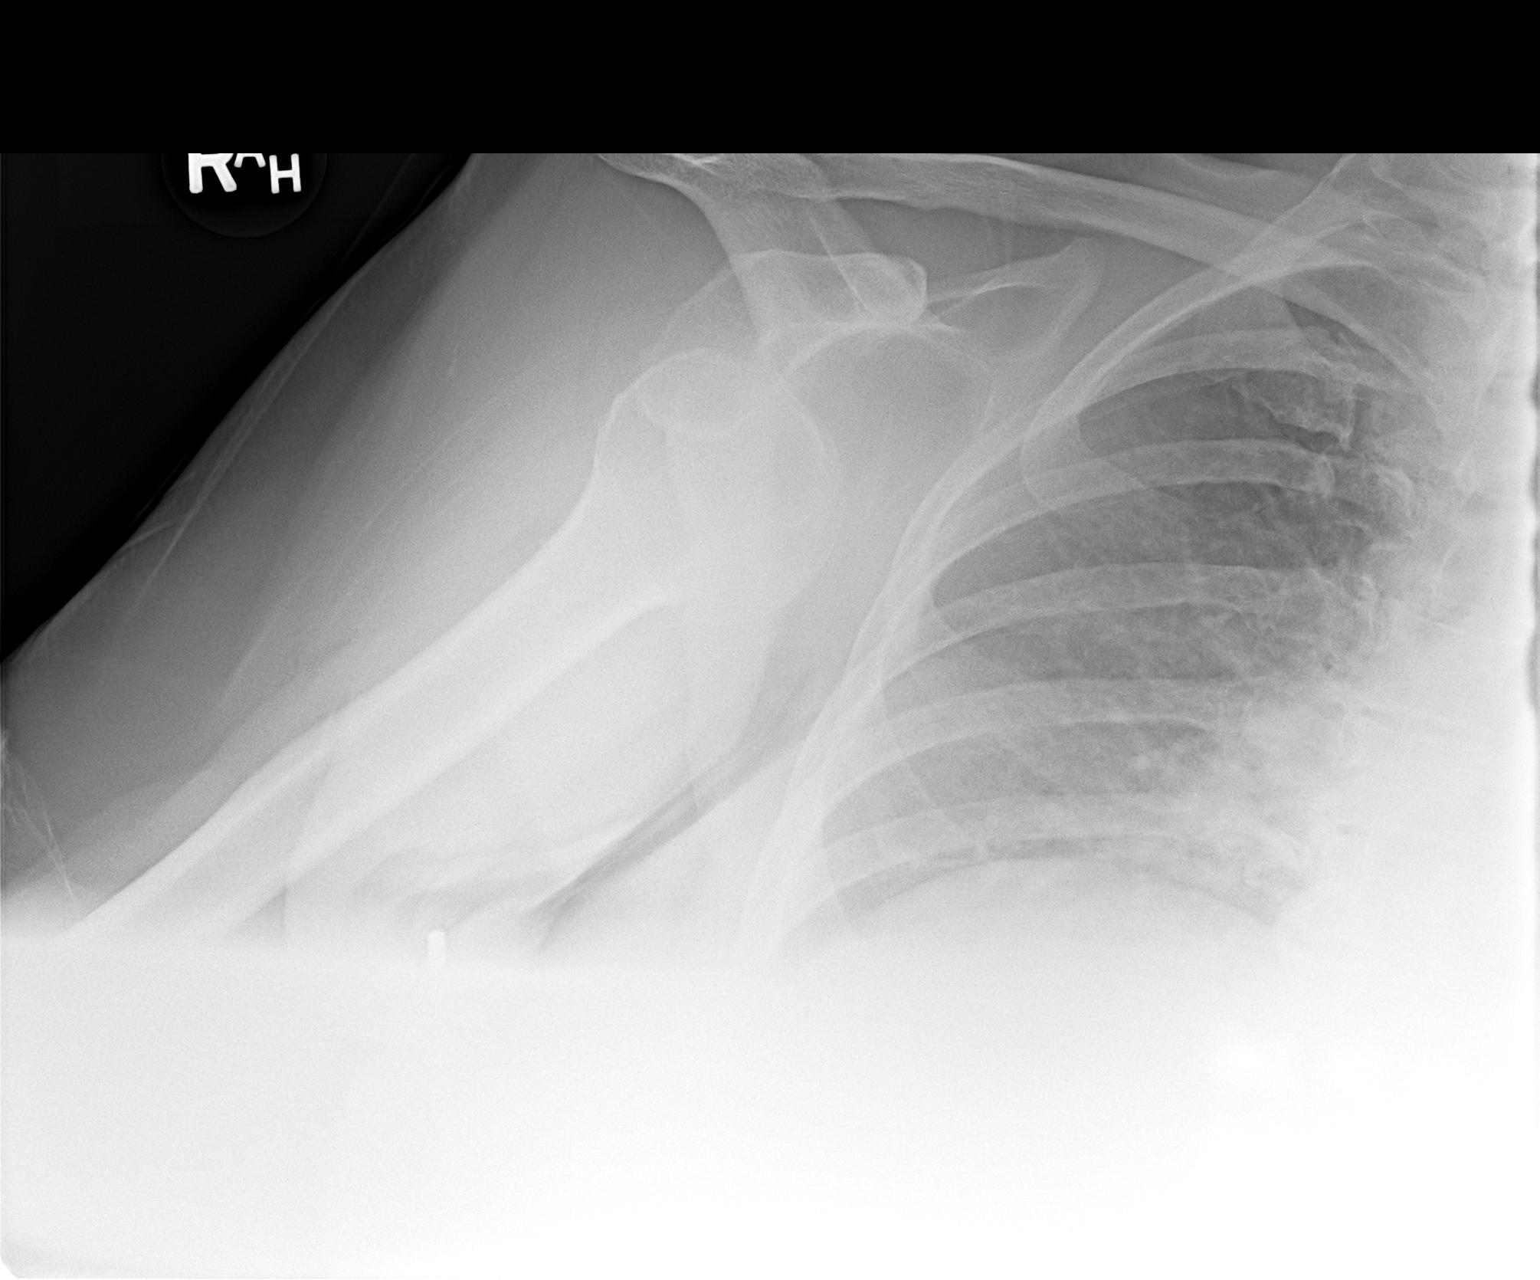

[2 of 2 positions shown; findings below may reference images not displayed]

FINDINGS: The patient has sustained inferior and presumably anterior
dislocation of the humeral head with respect to the bony glenoid. No
acute fracture is demonstrated. The AC joint is unremarkable. The
observed portions of the right clavicle and upper right ribs are
normal.
IMPRESSION: The patient has sustained acute inferior and presumably anterior
dislocation of the right shoulder.

## 2016-10-20 DIAGNOSIS — D044 Carcinoma in situ of skin of scalp and neck: Secondary | ICD-10-CM | POA: Diagnosis not present

## 2016-10-20 DIAGNOSIS — L570 Actinic keratosis: Secondary | ICD-10-CM | POA: Diagnosis not present

## 2016-10-20 DIAGNOSIS — Z85820 Personal history of malignant melanoma of skin: Secondary | ICD-10-CM | POA: Diagnosis not present

## 2016-10-20 DIAGNOSIS — Z08 Encounter for follow-up examination after completed treatment for malignant neoplasm: Secondary | ICD-10-CM | POA: Diagnosis not present

## 2016-12-18 DIAGNOSIS — M25562 Pain in left knee: Secondary | ICD-10-CM | POA: Diagnosis not present

## 2016-12-18 DIAGNOSIS — M25561 Pain in right knee: Secondary | ICD-10-CM | POA: Diagnosis not present

## 2017-01-21 DIAGNOSIS — M170 Bilateral primary osteoarthritis of knee: Secondary | ICD-10-CM | POA: Diagnosis not present

## 2017-04-29 DIAGNOSIS — Z85828 Personal history of other malignant neoplasm of skin: Secondary | ICD-10-CM | POA: Diagnosis not present

## 2017-04-29 DIAGNOSIS — Z08 Encounter for follow-up examination after completed treatment for malignant neoplasm: Secondary | ICD-10-CM | POA: Diagnosis not present

## 2017-05-04 DIAGNOSIS — M170 Bilateral primary osteoarthritis of knee: Secondary | ICD-10-CM | POA: Diagnosis not present

## 2017-05-11 DIAGNOSIS — Z1231 Encounter for screening mammogram for malignant neoplasm of breast: Secondary | ICD-10-CM | POA: Diagnosis not present

## 2017-06-16 DIAGNOSIS — M170 Bilateral primary osteoarthritis of knee: Secondary | ICD-10-CM | POA: Diagnosis not present

## 2017-07-08 DIAGNOSIS — R05 Cough: Secondary | ICD-10-CM | POA: Diagnosis not present

## 2017-07-08 DIAGNOSIS — I10 Essential (primary) hypertension: Secondary | ICD-10-CM | POA: Diagnosis not present

## 2017-07-08 DIAGNOSIS — Z01812 Encounter for preprocedural laboratory examination: Secondary | ICD-10-CM | POA: Diagnosis not present

## 2017-07-08 DIAGNOSIS — Z79899 Other long term (current) drug therapy: Secondary | ICD-10-CM | POA: Diagnosis not present

## 2017-08-01 ENCOUNTER — Emergency Department (HOSPITAL_BASED_OUTPATIENT_CLINIC_OR_DEPARTMENT_OTHER)
Admission: EM | Admit: 2017-08-01 | Discharge: 2017-08-01 | Disposition: A | Discharge status: Home / Self Care | Payer: Medicare Other | Attending: Emergency Medicine | Admitting: Emergency Medicine

## 2017-08-01 ENCOUNTER — Other Ambulatory Visit: Payer: Self-pay

## 2017-08-01 ENCOUNTER — Encounter (HOSPITAL_BASED_OUTPATIENT_CLINIC_OR_DEPARTMENT_OTHER): Payer: Self-pay | Admitting: Emergency Medicine

## 2017-08-01 ENCOUNTER — Emergency Department (HOSPITAL_BASED_OUTPATIENT_CLINIC_OR_DEPARTMENT_OTHER): Payer: Medicare Other

## 2017-08-01 DIAGNOSIS — M79661 Pain in right lower leg: Secondary | ICD-10-CM | POA: Diagnosis not present

## 2017-08-01 DIAGNOSIS — Z87891 Personal history of nicotine dependence: Secondary | ICD-10-CM | POA: Insufficient documentation

## 2017-08-01 DIAGNOSIS — M79604 Pain in right leg: Secondary | ICD-10-CM | POA: Diagnosis not present

## 2017-08-01 DIAGNOSIS — Z79899 Other long term (current) drug therapy: Secondary | ICD-10-CM | POA: Insufficient documentation

## 2017-08-01 HISTORY — DX: Unspecified osteoarthritis, unspecified site: M19.90

## 2017-08-01 NOTE — Discharge Instructions (Signed)
Your ultrasound today did not show evidence of DVT.  Please continue follow-up with your orthopedics team for further management of your chronic right knee pain.  If any symptoms change or worsen or he develop any shortness breath or chest pain, please return to the nearest emergency department.

## 2017-08-01 NOTE — ED Provider Notes (Signed)
[facility] EMERGENCY DEPARTMENT Provider Note   CSN: [id number] Arrival date & time: 08/01/17  1045     History   Chief Complaint No chief complaint on file. R leg pain   HPI Jennifer Tran is a 72 y.o. female.  The history is provided by the patient and medical records. No language interpreter was used.  Leg Pain   This is a new problem. The current episode started 2 days ago. The problem occurs constantly. The problem has been resolved. The pain is present in the right knee. The quality of the pain is described as aching and sharp. The pain is severe. Pertinent negatives include no numbness, full range of motion, no stiffness, no tingling and no itching. The symptoms are aggravated by standing. She has tried nothing for the symptoms. The treatment provided no relief. There has been no history of extremity trauma.    Past Medical History:  Diagnosis Date  . Anxiety   . Arthritis   . Reflux     There are no active problems to display for this patient.   Past Surgical History:  Procedure Laterality Date  . CHOLECYSTECTOMY       OB History   None      Home Medications    Prior to Admission medications   Medication Sig Start Date End Date Taking? Authorizing Provider  ALPRAZolam Duanne Moron) 0.25 MG tablet Take 0.25 mg by mouth at bedtime as needed for anxiety.    [provider]  benzonatate (TESSALON) 100 MG capsule Take 1-2 capsules (100-200 mg total) by mouth every 8 (eight) hours. 06/27/15   Noe Gens, PA-C  HYDROcodone-acetaminophen (NORCO) 5-325 MG per tablet Take 1-2 tablets by mouth every 4 (four) hours as needed. 01/16/14   Veryl Speak, MD  omeprazole (PRILOSEC) 20 MG capsule Take 20 mg by mouth daily.    [provider]  oseltamivir (TAMIFLU) 75 MG capsule Take 1 capsule (75 mg total) by mouth every 12 (twelve) hours. 06/27/15   Noe Gens, PA-C    Family History No family history on file.  Social History Social History    Tobacco Use  . Smoking status: Former Research scientist (life sciences)  . Smokeless tobacco: Never Used  Substance Use Topics  . Alcohol use: No  . Drug use: Not on file     Allergies   Ivp dye [iodinated diagnostic agents] and Penicillins   Review of Systems Review of Systems  Constitutional: Negative for chills, diaphoresis, fatigue and fever.  HENT: Negative for congestion, ear pain and sore throat.   Eyes: Negative for pain and visual disturbance.  Respiratory: Negative for cough, chest tightness and shortness of breath.   Cardiovascular: Negative for chest pain and palpitations.  Gastrointestinal: Negative for abdominal pain and vomiting.  Genitourinary: Negative for dysuria and hematuria.  Musculoskeletal: Negative for arthralgias, back pain, neck pain, neck stiffness and stiffness.  Skin: Negative for color change, itching, rash and wound.  Neurological: Negative for tingling, seizures, syncope and numbness.  All other systems reviewed and are negative.    Physical Exam Updated Vital Signs BP 122/79 (BP Location: Left Arm)   Pulse 79   Temp 98.4 F (36.9 C) (Oral)   Resp 18   Ht 5\' 4"  (1.626 m)   Wt 85.3 kg (188 lb)   SpO2 98%   BMI 32.27 kg/m   Physical Exam  Constitutional: She is oriented to person, place, and time. She appears well-developed and well-nourished. No distress.  HENT:  Head:  Normocephalic and atraumatic.  Mouth/Throat: Oropharynx is clear and moist.  Eyes: Conjunctivae are normal.  Neck: Neck supple.  Cardiovascular: Normal rate and regular rhythm.  No murmur heard. Pulmonary/Chest: Effort normal and breath sounds normal. No respiratory distress.  Abdominal: Soft. There is no tenderness.  Musculoskeletal: Normal range of motion. She exhibits no edema or tenderness.  Neurological: She is alert and oriented to person, place, and time. No sensory deficit. She exhibits normal muscle tone.  Skin: Skin is warm and dry. Capillary refill takes less than 2 seconds. No  rash noted. She is not diaphoretic. No erythema.  Psychiatric: She has a normal mood and affect.  Nursing note and vitals reviewed.    ED Treatments / Results  Labs (all labs ordered are listed, but only abnormal results are displayed) Labs Reviewed - No data to display  EKG None  Radiology US Venous Img Lower Unilateral Right  Result Date: 08/01/2017 CLINICAL DATA:  Right calf pain. EXAM: RIGHT LOWER EXTREMITY VENOUS DOPPLER ULTRASOUND TECHNIQUE: Gray-scale sonography with graded compression, as well as color Doppler and duplex ultrasound were performed to evaluate the lower extremity deep venous systems from the level of the common femoral vein and including the common femoral, femoral, profunda femoral, popliteal and calf veins including the posterior tibial, peroneal and gastrocnemius veins when visible. The superficial great saphenous vein was also interrogated. Spectral Doppler was utilized to evaluate flow at rest and with distal augmentation maneuvers in the common femoral, femoral and popliteal veins. COMPARISON:  None. FINDINGS: Contralateral Common Femoral Vein: Respiratory phasicity is normal and symmetric with the symptomatic side. No evidence of thrombus. Normal compressibility. Common Femoral Vein: No evidence of thrombus. Normal compressibility, respiratory phasicity and response to augmentation. Saphenofemoral Junction: No evidence of thrombus. Normal compressibility and flow on color Doppler imaging. Profunda Femoral Vein: No evidence of thrombus. Normal compressibility and flow on color Doppler imaging. Femoral Vein: No evidence of thrombus. Normal compressibility, respiratory phasicity and response to augmentation. Popliteal Vein: No evidence of thrombus. Normal compressibility, respiratory phasicity and response to augmentation. Calf Veins: No evidence of thrombus. Normal compressibility and flow on color Doppler imaging. Superficial Great Saphenous Vein: No evidence of thrombus.  Normal compressibility. Venous Reflux:  None. Other Findings: No evidence of superficial thrombophlebitis. Fluid collection in the popliteal fossa measures up to 4 cm in length and is consistent with a Baker's cyst. IMPRESSION: No evidence of right lower extremity deep venous thrombosis. Right popliteal fossa Baker's cyst present. Electronically Signed   By: Aletta Edouard M.D.   On: 08/01/2017 12:07    Procedures Procedures (including critical care time)  Medications Ordered in ED Medications - No data to display   Initial Impression / Assessment and Plan / ED Course  I have reviewed the triage vital signs and the nursing notes.  Pertinent labs & imaging results that were available during my care of the patient were reviewed by me and considered in my medical decision making (see chart for details).     Jennifer Tran is a 72 y.o. female with a past medical history significant for arthritis and is currently being managed by her orthopedist for right knee pain and is scheduled to have a right knee replacement next month who presents at the direction of her PCP for ultrasound to rule out DVT.  Patient reports that 2 days ago she began having pain behind her right knee that extended from the calf to the knee.  She reports it was tight and very painful.  She denies any swelling.  She reports that she has had no recent injuries and is scheduled to have her right knee replaced next month.  She denies any swelling or erythema.  She denies any skin injury or infections.  She called her PCP who told her to come to the ED for evaluation and ultrasound.  Patient denies numbness, tingling or weakness.  She reports that the pain has resolved and she is ambulate normally.  She denies any groin pain chest pain or shortness of breath.  No history of DVT or PE.  On exam, patient has no tenderness presently behind her right knee.  No other knee tenderness.  Normal range of motion of the knee.  No laceration or  erythema seen.  No abnormal sensation.  Normal pulses and coloration of the leg.  No significant edema seen.    Ultrasound was performed showing evidence of DVT.  Suspect patient pulled a muscle and has muscular pain behind her knee.  Given the reassuring ultrasound, do not feel she has a pulmonary embolism.  Patient felt stable for discharge home.  Do not feel x-ray would be as helpful as she is scheduled to have her knee replaced next month and she has no recent trauma causing her pain.    Patient understood plan of care and will follow up with her PCP and orthopedist.  Patient understood return precautions and was discharged in good condition.     Final Clinical Impressions(s) / ED Diagnoses   Final diagnoses:  Right leg pain    ED Discharge Orders    None      Clinical Impression: 1. Right leg pain     Disposition: Discharge  Condition: Good  I have discussed the results, Dx and Tx plan with the pt(& family if present). He/she/they expressed understanding and agree(s) with the plan. Discharge instructions discussed at great length. Strict return precautions discussed and pt &/or family have verbalized understanding of the instructions. No further questions at time of discharge.    New Prescriptions   No medications on file    Follow Up: Benjamine Sprague, [place] [address] [phone]     [facility] [address] [id number] [facility] [place] [phone]       Jese Comella, Gwenyth Allegra, MD 08/01/17 1714

## 2017-08-01 NOTE — ED Triage Notes (Signed)
Woke up  x 2 nights ago, pain to right leg. MD instructed to go have a u/s for possible DVT, scheduled to have a right knee replacement . No recent injury

## 2017-09-02 ENCOUNTER — Encounter: Payer: Self-pay | Admitting: Physician Assistant

## 2017-09-02 DIAGNOSIS — M170 Bilateral primary osteoarthritis of knee: Secondary | ICD-10-CM | POA: Diagnosis not present

## 2017-09-02 DIAGNOSIS — F419 Anxiety disorder, unspecified: Secondary | ICD-10-CM | POA: Diagnosis present

## 2017-09-02 DIAGNOSIS — K219 Gastro-esophageal reflux disease without esophagitis: Secondary | ICD-10-CM | POA: Diagnosis present

## 2017-09-02 DIAGNOSIS — M1711 Unilateral primary osteoarthritis, right knee: Secondary | ICD-10-CM | POA: Diagnosis present

## 2017-09-02 NOTE — H&P (Signed)
TOTAL KNEE ADMISSION H&P  Patient is being admitted for right total knee arthroplasty.  Subjective:  Chief Complaint:right knee pain.  HPI: Jennifer Tran, 72 y.o. female, has a history of pain and functional disability in the right knee due to arthritis and has failed non-surgical conservative treatments for greater than 12 weeks to includeNSAID's and/or analgesics, corticosteriod injections, viscosupplementation injections, flexibility and strengthening excercises and activity modification.  Onset of symptoms was gradual, starting 10 years ago with gradually worsening course since that time. The patient noted no past surgery on the right knee(s).  Patient currently rates pain in the right knee(s) at 10 out of 10 with activity. Patient has night pain, worsening of pain with activity and weight bearing, pain that interferes with activities of daily living, crepitus and joint swelling.  Patient has evidence of subchondral sclerosis, periarticular osteophytes and joint space narrowing by imaging studies.  There is no active infection.  Patient Active Problem List   Diagnosis Date Noted  . Primary localized osteoarthritis of right knee   . Anxiety   . GERD (gastroesophageal reflux disease)    Past Medical History:  Diagnosis Date  . Anxiety   . Arthritis   . Primary localized osteoarthritis of right knee   . Reflux     Past Surgical History:  Procedure Laterality Date  . CHOLECYSTECTOMY      No current facility-administered medications for this encounter.    Current Outpatient Medications  Medication Sig Dispense Refill Last Dose  . acetaminophen (TYLENOL) 325 MG tablet Take 325 mg by mouth every 6 (six) hours as needed (for pain/headaches.).   09/02/2017 at Unknown time  . ALPRAZolam (XANAX) 0.25 MG tablet Take 0.25 mg by mouth 2 (two) times daily as needed for anxiety.    09/02/2017 at Unknown time  . Cholecalciferol (VITAMIN D3) 2000 units TABS Take 2,000 Units by mouth daily.   09/02/2017  at Unknown time  . DULoxetine (CYMBALTA) 30 MG capsule Take 30 mg by mouth daily.  3 09/02/2017 at Unknown time  . famotidine (PEPCID AC) 10 MG chewable tablet Chew 10 mg by mouth daily.   09/02/2017 at Unknown time  . fluticasone (FLONASE) 50 MCG/ACT nasal spray Place 1 spray into both nostrils daily.   09/02/2017 at Unknown time  . metoprolol succinate (TOPROL-XL) 50 MG 24 hr tablet Take 50 mg by mouth daily. Take with or immediately following a meal.   09/02/2017 at Unknown time  . Multiple Vitamin (MULTIVITAMIN WITH MINERALS) TABS tablet Take 1 tablet by mouth daily. Centrum Silver   09/02/2017 at Unknown time  . vitamin B-12 (CYANOCOBALAMIN) 500 MCG tablet Take 500 mcg by mouth daily.   09/02/2017 at Unknown time   Allergies  Allergen Reactions  . Ivp Dye [Iodinated Diagnostic Agents] Anaphylaxis  . Nsaids Swelling and Other (See Comments)    Ankle swelling   . Penicillins Rash    Has patient had a PCN reaction causing immediate rash, facial/tongue/throat swelling, SOB or lightheadedness with hypotension: Yes Has patient had a PCN reaction causing severe rash involving mucus membranes or skin necrosis: Unknown Has patient had a PCN reaction that required hospitalization: No Has patient had a PCN reaction occurring within the last 10 years: No If all of the above answers are "NO", then may proceed with Cephalosporin use.     Social History   Tobacco Use  . Smoking status: Former Research scientist (life sciences)  . Smokeless tobacco: Never Used  Substance Use Topics  . Alcohol use: No  No family history on file.   Review of Systems  Constitutional: Negative.   HENT: Negative.   Eyes: Negative.   Respiratory: Negative.   Cardiovascular: Negative.   Gastrointestinal: Negative.   Genitourinary: Negative.   Musculoskeletal: Positive for back pain and joint pain.  Skin: Negative.   Neurological: Negative.   Endo/Heme/Allergies: Negative.   Psychiatric/Behavioral: Negative.     Objective:  Physical Exam   Constitutional: She is oriented to person, place, and time. She appears well-developed and well-nourished.  HENT:  Head: Normocephalic and atraumatic.  Mouth/Throat: Oropharynx is clear and moist.  Eyes: Pupils are equal, round, and reactive to light. Conjunctivae are normal.  Neck: Neck supple.  Cardiovascular: Normal rate and regular rhythm.  Respiratory: Effort normal and breath sounds normal.  GI: Soft. Bowel sounds are normal.  Genitourinary:  Genitourinary Comments: Not pertinent to current symptomatology therefore not examined.  Musculoskeletal:   Examination of bilateral knees reveal pain bilaterally.  1+ synovitis.  1+ crepitation.  The right knee has a popliteal cyst noted.  Range of motion -5 to 125 degrees.  Moderate varus deformity bilaterally.  Knees are stable with normal patella tracking.  Vascular exam: Pulses are 2+ and symmetric  Neurological: She is alert and oriented to person, place, and time.  Skin: Skin is warm and dry.  Psychiatric: She has a normal mood and affect. Her behavior is normal.    Vital signs in last 24 hours: Temp:  [98.5 F (36.9 C)] 98.5 F (36.9 C) (05/08 1400) Pulse Rate:  [88] 88 (05/08 1400) BP: (138)/(83) 138/83 (05/08 1400) SpO2:  [95 %] 95 % (05/08 1400) Weight:  [83.6 kg (184 lb 6.4 oz)] 83.6 kg (184 lb 6.4 oz) (05/08 1400)  Labs:   Estimated body mass index is 31.65 kg/m as calculated from the following:   Height as of this encounter: 5\' 4"  (1.626 m).   Weight as of this encounter: 83.6 kg (184 lb 6.4 oz).   Imaging Review Plain radiographs demonstrate severe degenerative joint disease of the right knee(s). The overall alignment issignificant varus. The bone quality appears to be good for age and reported activity level.   Preoperative templating of the joint replacement has been completed, documented, and submitted to the Operating Room personnel in order to optimize intra-operative equipment management.   Anticipated LOS  equal to or greater than 2 midnights due to - Age 1 and older with one or more of the following:  - Obesity  - Expected need for hospital services (PT, OT, Nursing) required for safe  discharge  - Anticipated need for postoperative skilled nursing care or inpatient rehab  - Active co-morbidities: chronic back pain and anxiety OR        Assessment/Plan:  End stage arthritis, right knee   The patient history, physical examination, clinical judgment of the provider and imaging studies are consistent with end stage degenerative joint disease of the right knee(s) and total knee arthroplasty is deemed medically necessary. The treatment options including medical management, injection therapy arthroscopy and arthroplasty were discussed at length. The risks and benefits of total knee arthroplasty were presented and reviewed. The risks due to aseptic loosening, infection, stiffness, patella tracking problems, thromboembolic complications and other imponderables were discussed. The patient acknowledged the explanation, agreed to proceed with the plan and consent was signed. Patient is being admitted for inpatient treatment for surgery, pain control, PT, OT, prophylactic antibiotics, VTE prophylaxis, progressive ambulation and ADL's and discharge planning. The patient is planning to be  discharged home with home health services   May go to [facility] if does not do well.

## 2017-09-02 NOTE — Pre-Procedure Instructions (Signed)
Jennifer Tran  09/02/2017      [facility] [phone] - [place], [place] - [address] [place] [address] Phone: [phone] Fax: [phone]    Your procedure is scheduled on May 20  Report to [facility] at [place].M.  Call this number if you have problems the morning of surgery:  [phone]   Remember:  Do not eat food or drink liquids after midnight.  Continue all medications as directed by your physician except follow these medication instructions before surgery below   Take these medicines the morning of surgery with A SIP OF WATER  acetaminophen (TYLENOL)  ALPRAZolam (XANAX)  DULoxetine (CYMBALTA) famotidine (PEPCID AC)  fluticasone (FLONASE) metoprolol succinate (TOPROL-XL)  7 days prior to surgery STOP taking any Aspirin(unless otherwise instructed by your surgeon), Aleve, Naproxen, Ibuprofen, Motrin, Advil, Goody's, BC's, all herbal medications, fish oil, and all vitamins    Do not wear jewelry, make-up or nail polish.  Do not wear lotions, powders, or perfumes, or deodorant.  Do not shave 48 hours prior to surgery.    Do not bring valuables to the hospital.  [facility] is not responsible for any belongings or valuables.  Contacts, dentures or bridgework may not be worn into surgery.  Leave your suitcase in the car.  After surgery it may be brought to your room.  For patients admitted to the hospital, discharge time will be determined by your treatment team.  Patients discharged the day of surgery will not be allowed to drive home.   Special instructions:   [facility]- Preparing For Surgery  Before surgery, you can play an important role. Because skin is not sterile, your skin needs to be as free of germs as possible. You can reduce the number of germs on your skin by washing with CHG (chlorahexidine gluconate) Soap before surgery.  CHG is an antiseptic cleaner which kills  germs and bonds with the skin to continue killing germs even after washing.  Oral Hygiene is also important to reduce your risk of infection.  Remember - brush your teeth the the day of surgery.  Please do not use if you have an allergy to CHG or antibacterial soaps. If your skin becomes reddened/irritated stop using the CHG.  Do not shave (including legs and underarms) for at least 48 hours prior to first CHG shower. It is OK to shave your face.  Please follow these instructions carefully.   1. Shower the NIGHT BEFORE SURGERY and the MORNING OF SURGERY with CHG.   2. If you chose to wash your hair, wash your hair first as usual with your normal shampoo.  3. After you shampoo, rinse your hair and body thoroughly to remove the shampoo.  4. Use CHG as you would any other liquid soap. You can apply CHG directly to the skin and wash gently with a scrungie or a clean washcloth.   5. Apply the CHG Soap to your body ONLY FROM THE NECK DOWN.  Do not use on open wounds or open sores. Avoid contact with your eyes, ears, mouth and genitals (private parts). Wash Face and genitals (private parts)  with your normal soap.  6. Wash thoroughly, paying special attention to the area where your surgery will be performed.  7. Thoroughly rinse your body with warm water from the neck down.  8. DO NOT shower/wash with your normal soap after using  and rinsing off the CHG Soap.  9. Pat yourself dry with a CLEAN TOWEL.  10. Wear CLEAN PAJAMAS to bed the night before surgery, wear comfortable clothes the morning of surgery  11. Place CLEAN SHEETS on your bed the night of your first shower and DO NOT SLEEP WITH PETS.    Day of Surgery: Do not apply any deodorants/lotions. Please wear clean clothes to the hospital/surgery center.  Remember to brush your teeth.      Please read over the following fact sheets that you were given.

## 2017-09-03 ENCOUNTER — Encounter (HOSPITAL_COMMUNITY)
Admission: RE | Admit: 2017-09-03 | Discharge: 2017-09-03 | Disposition: A | Discharge status: Home / Self Care | Payer: Medicare Other | Source: Ambulatory Visit | Attending: Orthopedic Surgery | Admitting: Orthopedic Surgery

## 2017-09-03 ENCOUNTER — Other Ambulatory Visit: Payer: Self-pay

## 2017-09-03 ENCOUNTER — Encounter (HOSPITAL_COMMUNITY): Payer: Self-pay

## 2017-09-03 DIAGNOSIS — Z9049 Acquired absence of other specified parts of digestive tract: Secondary | ICD-10-CM | POA: Diagnosis not present

## 2017-09-03 DIAGNOSIS — M1711 Unilateral primary osteoarthritis, right knee: Secondary | ICD-10-CM | POA: Diagnosis not present

## 2017-09-03 DIAGNOSIS — K219 Gastro-esophageal reflux disease without esophagitis: Secondary | ICD-10-CM | POA: Insufficient documentation

## 2017-09-03 DIAGNOSIS — Z01812 Encounter for preprocedural laboratory examination: Secondary | ICD-10-CM | POA: Diagnosis not present

## 2017-09-03 DIAGNOSIS — R001 Bradycardia, unspecified: Secondary | ICD-10-CM | POA: Insufficient documentation

## 2017-09-03 DIAGNOSIS — Z79899 Other long term (current) drug therapy: Secondary | ICD-10-CM | POA: Diagnosis not present

## 2017-09-03 DIAGNOSIS — F419 Anxiety disorder, unspecified: Secondary | ICD-10-CM | POA: Diagnosis not present

## 2017-09-03 DIAGNOSIS — Z87891 Personal history of nicotine dependence: Secondary | ICD-10-CM | POA: Insufficient documentation

## 2017-09-03 DIAGNOSIS — Z01810 Encounter for preprocedural cardiovascular examination: Secondary | ICD-10-CM | POA: Insufficient documentation

## 2017-09-03 HISTORY — DX: Malignant (primary) neoplasm, unspecified: C80.1

## 2017-09-03 HISTORY — DX: Gastro-esophageal reflux disease without esophagitis: K21.9

## 2017-09-03 HISTORY — DX: Essential (primary) hypertension: I10

## 2017-09-03 LAB — CBC WITH DIFFERENTIAL/PLATELET
BASOS PCT: 0 %
Basophils Absolute: 0 10*3/uL (ref 0.0–0.1)
Eosinophils Absolute: 0.3 10*3/uL (ref 0.0–0.7)
Eosinophils Relative: 3 %
HEMATOCRIT: 44.2 % (ref 36.0–46.0)
HEMOGLOBIN: 14.4 g/dL (ref 12.0–15.0)
LYMPHS ABS: 2.1 10*3/uL (ref 0.7–4.0)
LYMPHS PCT: 22 %
MCH: 29.1 pg (ref 26.0–34.0)
MCHC: 32.6 g/dL (ref 30.0–36.0)
MCV: 89.5 fL (ref 78.0–100.0)
Monocytes Absolute: 0.6 10*3/uL (ref 0.1–1.0)
Monocytes Relative: 7 %
NEUTROS ABS: 6.4 10*3/uL (ref 1.7–7.7)
NEUTROS PCT: 68 %
Platelets: 277 10*3/uL (ref 150–400)
RBC: 4.94 MIL/uL (ref 3.87–5.11)
RDW: 12.8 % (ref 11.5–15.5)
WBC: 9.4 10*3/uL (ref 4.0–10.5)

## 2017-09-03 LAB — COMPREHENSIVE METABOLIC PANEL
ALBUMIN: 4.2 g/dL (ref 3.5–5.0)
ALK PHOS: 98 U/L (ref 38–126)
ALT: 21 U/L (ref 14–54)
ANION GAP: 8 (ref 5–15)
AST: 20 U/L (ref 15–41)
BILIRUBIN TOTAL: 1.1 mg/dL (ref 0.3–1.2)
BUN: 18 mg/dL (ref 6–20)
CO2: 29 mmol/L (ref 22–32)
Calcium: 9.7 mg/dL (ref 8.9–10.3)
Chloride: 106 mmol/L (ref 101–111)
Creatinine, Ser: 0.99 mg/dL (ref 0.44–1.00)
GFR calc Af Amer: >60 mL/min (ref >60)
GFR, EST NON AFRICAN AMERICAN: 56 mL/min — AB (ref >60)
GLUCOSE: 97 mg/dL (ref 65–99)
Potassium: 4.1 mmol/L (ref 3.5–5.1)
Sodium: 143 mmol/L (ref 135–145)
Total Protein: 7 g/dL (ref 6.5–8.1)

## 2017-09-03 LAB — APTT: aPTT: 30 seconds (ref 24–36)

## 2017-09-03 LAB — SURGICAL PCR SCREEN
MRSA, PCR: NEGATIVE
Staphylococcus aureus: POSITIVE — AB

## 2017-09-03 LAB — PROTIME-INR
INR: 1
Prothrombin Time: 13.1 seconds (ref 11.4–15.2)

## 2017-09-03 NOTE — Pre-Procedure Instructions (Signed)
Jennifer Tran  09/03/2017      [facility] [phone] - [place] - [address] [place] [address] Phone: [phone] Fax: [phone]    Your procedure is scheduled on Monday, May 20th   Report to [place] at [place].M.             (posted surgery time 7:30a - 9:25p)   Call this number if you have problems the morning of surgery:  [phone]   Remember:   Do not eat food or drink liquids after midnight, Sunday.  Continue all medications as directed by your physician except follow these medication instructions before surgery below   Take these medicines the morning of surgery with A SIP OF WATER  acetaminophen (TYLENOL)  ALPRAZolam (XANAX)  DULoxetine (CYMBALTA) famotidine (PEPCID AC)  fluticasone (FLONASE) metoprolol succinate (TOPROL-XL)  7 days prior to surgery STOP taking any Aspirin(unless otherwise instructed by your surgeon), Aleve, Naproxen, Ibuprofen, Motrin, Advil, Goody's, BC's, all herbal medications, fish oil, and all vitamins    Do not wear jewelry, make-up or nail polish.  Do not wear lotions, powders, perfumes or deodorant.  Do not shave 48 hours prior to surgery.    Do not bring valuables to the hospital.  [facility] is not responsible for any belongings or valuables.  Contacts, dentures or bridgework may not be worn into surgery.  Leave your suitcase in the car.  After surgery it may be brought to your room.  For patients admitted to the hospital, discharge time will be determined by your treatment team.    Special instructions:   [place]- Preparing For Surgery  Before surgery, you can play an important role. Because skin is not sterile, your skin needs to be as free of germs as possible. You can reduce the number of germs on your skin by washing with CHG (chlorahexidine gluconate) Soap before surgery.  CHG is an antiseptic cleaner which kills  germs and bonds with the skin to continue killing germs even after washing.  Oral Hygiene is also important to reduce your risk of infection.  Remember - brush your teeth the the day of surgery.  Please do not use if you have an allergy to CHG or antibacterial soaps. If your skin becomes reddened/irritated stop using the CHG.  Do not shave (including legs and underarms) for at least 48 hours prior to first CHG shower. It is OK to shave your face.  Please follow these instructions carefully.   1. Shower the NIGHT BEFORE SURGERY and the MORNING OF SURGERY with CHG.   2. If you chose to wash your hair, wash your hair first as usual with your normal shampoo.  3. After you shampoo, rinse your hair and body thoroughly to remove the shampoo.  4. Use CHG as you would any other liquid soap. You can apply CHG directly to the skin and wash gently with a scrungie or a clean washcloth.   5. Apply the CHG Soap to your body ONLY FROM THE NECK DOWN.  Do not use on open wounds or open sores. Avoid contact with your eyes, ears, mouth and genitals (private parts). Wash Face and genitals (private parts)  with your normal soap.  6. Wash thoroughly, paying special attention to the area where your surgery will be performed.  7. Thoroughly rinse your body with warm water from the neck down.  8.  DO NOT shower/wash with your normal soap after using and rinsing off the CHG Soap.  9. Pat yourself dry with a CLEAN TOWEL.  10. Wear CLEAN PAJAMAS to bed the night before surgery, wear comfortable clothes the morning of surgery  11. Place CLEAN SHEETS on your bed the night of your first shower and DO NOT SLEEP WITH PETS.    Day of Surgery: Do not apply any deodorants/lotions. Please wear clean clothes to the hospital/surgery center.  Remember to brush your teeth.      Please read over the following fact sheets that you were given.

## 2017-09-03 NOTE — Progress Notes (Signed)
PCP is Dr. Roswell Miners  LOV 07/2017 Denies any cardiac issues, no cp, sob, no cardiac tests.

## 2017-09-04 LAB — URINE CULTURE: Culture: NO GROWTH

## 2017-09-09 ENCOUNTER — Other Ambulatory Visit: Payer: Self-pay | Admitting: Orthopedic Surgery

## 2017-09-09 NOTE — Care Plan (Signed)
Met with patient in the office with PA. We discussed all options for discharge. Requested bed at [facility] and arranged HHPT with [facility] depending on how she does after surgery. She has all needed equipment at home She is set to see Dr. Noemi Chapel in the office on 09/28/17 @ 430 and currently set to start [place] on 09/22/17 @ 1230 at [place] may change depedning on dispostion.   Please contact Ladell Heads, [place] with questions or if this plan needs to change.

## 2017-09-11 MED ORDER — VANCOMYCIN HCL IN DEXTROSE 1-5 GM/200ML-% IV SOLN
1000.0000 mg | INTRAVENOUS | Status: AC
  Administered 2017-09-14: 1000 mg via INTRAVENOUS
  Filled 2017-09-11: qty 200

## 2017-09-11 MED ORDER — TRANEXAMIC ACID 1000 MG/10ML IV SOLN
1000.0000 mg | INTRAVENOUS | Status: AC
  Administered 2017-09-14: 1000 mg via INTRAVENOUS
  Filled 2017-09-11: qty 1100

## 2017-09-11 MED ORDER — BUPIVACAINE LIPOSOME 1.3 % IJ SUSP
20.0000 mL | INTRAMUSCULAR | Status: DC
  Filled 2017-09-11: qty 20

## 2017-09-14 ENCOUNTER — Inpatient Hospital Stay (HOSPITAL_COMMUNITY)
Admission: RE | Admit: 2017-09-14 | Discharge: 2017-09-16 | DRG: 470 | Disposition: A | Discharge status: Home Health | Payer: Medicare Other | Source: Ambulatory Visit | Attending: Orthopedic Surgery | Admitting: Orthopedic Surgery

## 2017-09-14 ENCOUNTER — Encounter (HOSPITAL_COMMUNITY)
Admission: RE | Disposition: A | Discharge status: Home Health | Payer: Self-pay | Source: Ambulatory Visit | Attending: Orthopedic Surgery

## 2017-09-14 ENCOUNTER — Encounter (HOSPITAL_COMMUNITY): Payer: Self-pay

## 2017-09-14 ENCOUNTER — Inpatient Hospital Stay (HOSPITAL_COMMUNITY): Payer: Medicare Other | Admitting: Anesthesiology

## 2017-09-14 DIAGNOSIS — R0989 Other specified symptoms and signs involving the circulatory and respiratory systems: Secondary | ICD-10-CM | POA: Diagnosis present

## 2017-09-14 DIAGNOSIS — Z91041 Radiographic dye allergy status: Secondary | ICD-10-CM

## 2017-09-14 DIAGNOSIS — R0902 Hypoxemia: Secondary | ICD-10-CM | POA: Diagnosis not present

## 2017-09-14 DIAGNOSIS — K219 Gastro-esophageal reflux disease without esophagitis: Secondary | ICD-10-CM | POA: Diagnosis present

## 2017-09-14 DIAGNOSIS — I10 Essential (primary) hypertension: Secondary | ICD-10-CM | POA: Diagnosis present

## 2017-09-14 DIAGNOSIS — F419 Anxiety disorder, unspecified: Secondary | ICD-10-CM | POA: Diagnosis present

## 2017-09-14 DIAGNOSIS — Z22321 Carrier or suspected carrier of Methicillin susceptible Staphylococcus aureus: Secondary | ICD-10-CM

## 2017-09-14 DIAGNOSIS — M1711 Unilateral primary osteoarthritis, right knee: Secondary | ICD-10-CM | POA: Diagnosis present

## 2017-09-14 DIAGNOSIS — Z85820 Personal history of malignant melanoma of skin: Secondary | ICD-10-CM | POA: Diagnosis not present

## 2017-09-14 DIAGNOSIS — E669 Obesity, unspecified: Secondary | ICD-10-CM | POA: Diagnosis not present

## 2017-09-14 DIAGNOSIS — Z6831 Body mass index (BMI) 31.0-31.9, adult: Secondary | ICD-10-CM

## 2017-09-14 DIAGNOSIS — Z886 Allergy status to analgesic agent status: Secondary | ICD-10-CM

## 2017-09-14 DIAGNOSIS — R7981 Abnormal blood-gas level: Secondary | ICD-10-CM

## 2017-09-14 DIAGNOSIS — Z880 Allergy status to penicillin: Secondary | ICD-10-CM

## 2017-09-14 DIAGNOSIS — D72829 Elevated white blood cell count, unspecified: Secondary | ICD-10-CM | POA: Diagnosis not present

## 2017-09-14 DIAGNOSIS — Z87891 Personal history of nicotine dependence: Secondary | ICD-10-CM | POA: Diagnosis not present

## 2017-09-14 DIAGNOSIS — G8918 Other acute postprocedural pain: Secondary | ICD-10-CM | POA: Diagnosis not present

## 2017-09-14 HISTORY — DX: Unilateral primary osteoarthritis, right knee: M17.11

## 2017-09-14 HISTORY — DX: Elevated white blood cell count, unspecified: D72.829

## 2017-09-14 HISTORY — DX: Carrier or suspected carrier of methicillin susceptible Staphylococcus aureus: Z22.321

## 2017-09-14 HISTORY — PX: TOTAL KNEE ARTHROPLASTY: SHX125

## 2017-09-14 SURGERY — ARTHROPLASTY, KNEE, TOTAL
Anesthesia: General | Site: Knee | Laterality: Right

## 2017-09-14 MED ORDER — ROPIVACAINE HCL 5 MG/ML IJ SOLN
INTRAMUSCULAR | Status: DC | PRN
  Administered 2017-09-14: 30 mL via PERINEURAL

## 2017-09-14 MED ORDER — ALBUTEROL SULFATE HFA 108 (90 BASE) MCG/ACT IN AERS
INHALATION_SPRAY | RESPIRATORY_TRACT | Status: DC | PRN
  Administered 2017-09-14: 6 via RESPIRATORY_TRACT

## 2017-09-14 MED ORDER — LIDOCAINE HCL (CARDIAC) PF 100 MG/5ML IV SOSY
PREFILLED_SYRINGE | INTRAVENOUS | Status: DC | PRN
  Administered 2017-09-14: 100 mg via INTRAVENOUS

## 2017-09-14 MED ORDER — MIDAZOLAM HCL 2 MG/2ML IJ SOLN
INTRAMUSCULAR | Status: AC
  Filled 2017-09-14: qty 2

## 2017-09-14 MED ORDER — ALUM & MAG HYDROXIDE-SIMETH 200-200-20 MG/5ML PO SUSP: 30.0000 mL | ORAL | Status: DC | PRN

## 2017-09-14 MED ORDER — POTASSIUM CHLORIDE IN NACL 20-0.9 MEQ/L-% IV SOLN
INTRAVENOUS | Status: DC
  Administered 2017-09-14 – 2017-09-16 (×2): via INTRAVENOUS
  Filled 2017-09-14 (×3): qty 1000

## 2017-09-14 MED ORDER — CHLORHEXIDINE GLUCONATE 4 % EX LIQD: 60.0000 mL | Freq: Once | CUTANEOUS | Status: DC

## 2017-09-14 MED ORDER — 0.9 % SODIUM CHLORIDE (POUR BTL) OPTIME
TOPICAL | Status: DC | PRN
  Administered 2017-09-14: 1000 mL

## 2017-09-14 MED ORDER — ALPRAZOLAM 0.25 MG PO TABS
0.2500 mg | ORAL_TABLET | Freq: Three times a day (TID) | ORAL | Status: DC
  Administered 2017-09-14 – 2017-09-15 (×2): 0.25 mg via ORAL
  Filled 2017-09-14 (×5): qty 1

## 2017-09-14 MED ORDER — ONDANSETRON HCL 4 MG/2ML IJ SOLN
INTRAMUSCULAR | Status: DC | PRN
  Administered 2017-09-14: 4 mg via INTRAVENOUS

## 2017-09-14 MED ORDER — ACETAMINOPHEN 500 MG PO TABS
1000.0000 mg | ORAL_TABLET | Freq: Four times a day (QID) | ORAL | Status: AC
  Administered 2017-09-14 – 2017-09-15 (×3): 1000 mg via ORAL
  Filled 2017-09-14 (×3): qty 2

## 2017-09-14 MED ORDER — BUPIVACAINE-EPINEPHRINE (PF) 0.5% -1:200000 IJ SOLN
INTRAMUSCULAR | Status: AC
  Filled 2017-09-14: qty 60

## 2017-09-14 MED ORDER — ONDANSETRON HCL 4 MG/2ML IJ SOLN: 4.0000 mg | Freq: Four times a day (QID) | INTRAMUSCULAR | Status: DC | PRN

## 2017-09-14 MED ORDER — FAMOTIDINE 10 MG PO TABS
10.0000 mg | ORAL_TABLET | Freq: Every day | ORAL | Status: DC
  Administered 2017-09-15 – 2017-09-16 (×2): 10 mg via ORAL
  Filled 2017-09-14 (×3): qty 1

## 2017-09-14 MED ORDER — DULOXETINE HCL 30 MG PO CPEP
30.0000 mg | ORAL_CAPSULE | Freq: Every day | ORAL | Status: DC
  Administered 2017-09-15 – 2017-09-16 (×2): 30 mg via ORAL
  Filled 2017-09-14 (×2): qty 1

## 2017-09-14 MED ORDER — DEXAMETHASONE SODIUM PHOSPHATE 4 MG/ML IJ SOLN
INTRAMUSCULAR | Status: DC | PRN
  Administered 2017-09-14: 10 mg via INTRAVENOUS

## 2017-09-14 MED ORDER — PROPOFOL 500 MG/50ML IV EMUL
INTRAVENOUS | Status: DC | PRN
  Administered 2017-09-14: 100 ug/kg/min via INTRAVENOUS

## 2017-09-14 MED ORDER — POVIDONE-IODINE 7.5 % EX SOLN
Freq: Once | CUTANEOUS | Status: DC
  Filled 2017-09-14: qty 118

## 2017-09-14 MED ORDER — ONDANSETRON HCL 4 MG PO TABS: 4.0000 mg | ORAL_TABLET | Freq: Four times a day (QID) | ORAL | Status: DC | PRN

## 2017-09-14 MED ORDER — FLUTICASONE PROPIONATE 50 MCG/ACT NA SUSP
1.0000 | Freq: Every day | NASAL | Status: DC
  Administered 2017-09-15 – 2017-09-16 (×2): 1 via NASAL
  Filled 2017-09-14: qty 16

## 2017-09-14 MED ORDER — SODIUM CHLORIDE 0.9 % IJ SOLN
INTRAMUSCULAR | Status: DC | PRN
  Administered 2017-09-14: 50 mL

## 2017-09-14 MED ORDER — MENTHOL 3 MG MT LOZG: 1.0000 | LOZENGE | OROMUCOSAL | Status: DC | PRN

## 2017-09-14 MED ORDER — BUPIVACAINE LIPOSOME 1.3 % IJ SUSP
INTRAMUSCULAR | Status: DC | PRN
  Administered 2017-09-14: 20 mL

## 2017-09-14 MED ORDER — DIPHENHYDRAMINE HCL 12.5 MG/5ML PO ELIX: 12.5000 mg | ORAL_SOLUTION | ORAL | Status: DC | PRN

## 2017-09-14 MED ORDER — METOPROLOL SUCCINATE ER 50 MG PO TB24
50.0000 mg | ORAL_TABLET | Freq: Every day | ORAL | Status: DC
  Administered 2017-09-15 – 2017-09-16 (×2): 50 mg via ORAL
  Filled 2017-09-14 (×2): qty 1

## 2017-09-14 MED ORDER — SUCCINYLCHOLINE CHLORIDE 200 MG/10ML IV SOSY
PREFILLED_SYRINGE | INTRAVENOUS | Status: AC
  Filled 2017-09-14: qty 10

## 2017-09-14 MED ORDER — ONDANSETRON HCL 4 MG/2ML IJ SOLN
INTRAMUSCULAR | Status: AC
  Filled 2017-09-14: qty 2

## 2017-09-14 MED ORDER — DOCUSATE SODIUM 100 MG PO CAPS
100.0000 mg | ORAL_CAPSULE | Freq: Two times a day (BID) | ORAL | Status: DC
  Administered 2017-09-14 – 2017-09-16 (×4): 100 mg via ORAL
  Filled 2017-09-14 (×4): qty 1

## 2017-09-14 MED ORDER — ASPIRIN EC 325 MG PO TBEC
325.0000 mg | DELAYED_RELEASE_TABLET | Freq: Every day | ORAL | Status: DC
  Administered 2017-09-15 – 2017-09-16 (×2): 325 mg via ORAL
  Filled 2017-09-14 (×2): qty 1

## 2017-09-14 MED ORDER — METOCLOPRAMIDE HCL 5 MG/ML IJ SOLN: 5.0000 mg | Freq: Three times a day (TID) | INTRAMUSCULAR | Status: DC | PRN

## 2017-09-14 MED ORDER — LACTATED RINGERS IV SOLN
INTRAVENOUS | Status: DC
  Administered 2017-09-14 (×2): via INTRAVENOUS

## 2017-09-14 MED ORDER — OXYCODONE HCL 5 MG/5ML PO SOLN: 5.0000 mg | Freq: Once | ORAL | Status: DC | PRN

## 2017-09-14 MED ORDER — FENTANYL CITRATE (PF) 100 MCG/2ML IJ SOLN
INTRAMUSCULAR | Status: AC
  Filled 2017-09-14: qty 2

## 2017-09-14 MED ORDER — OXYCODONE HCL 5 MG PO TABS: 5.0000 mg | ORAL_TABLET | Freq: Once | ORAL | Status: DC | PRN

## 2017-09-14 MED ORDER — SUCCINYLCHOLINE CHLORIDE 20 MG/ML IJ SOLN
INTRAMUSCULAR | Status: DC | PRN
  Administered 2017-09-14: 100 mg via INTRAVENOUS

## 2017-09-14 MED ORDER — DEXAMETHASONE SODIUM PHOSPHATE 10 MG/ML IJ SOLN
10.0000 mg | Freq: Three times a day (TID) | INTRAMUSCULAR | Status: AC
  Administered 2017-09-14 – 2017-09-15 (×4): 10 mg via INTRAVENOUS
  Filled 2017-09-14 (×3): qty 1

## 2017-09-14 MED ORDER — GABAPENTIN 300 MG PO CAPS
300.0000 mg | ORAL_CAPSULE | Freq: Every day | ORAL | Status: DC
  Administered 2017-09-14 – 2017-09-15 (×2): 300 mg via ORAL
  Filled 2017-09-14 (×2): qty 1

## 2017-09-14 MED ORDER — DEXAMETHASONE SODIUM PHOSPHATE 10 MG/ML IJ SOLN
INTRAMUSCULAR | Status: AC
  Filled 2017-09-14: qty 1

## 2017-09-14 MED ORDER — METOCLOPRAMIDE HCL 5 MG PO TABS: 5.0000 mg | ORAL_TABLET | Freq: Three times a day (TID) | ORAL | Status: DC | PRN

## 2017-09-14 MED ORDER — CEFUROXIME SODIUM 1.5 G IV SOLR
INTRAVENOUS | Status: AC
  Filled 2017-09-14: qty 1.5

## 2017-09-14 MED ORDER — POLYETHYLENE GLYCOL 3350 17 G PO PACK
17.0000 g | PACK | Freq: Two times a day (BID) | ORAL | Status: DC
  Administered 2017-09-15 – 2017-09-16 (×2): 17 g via ORAL
  Filled 2017-09-14 (×2): qty 1

## 2017-09-14 MED ORDER — PHENYLEPHRINE 40 MCG/ML (10ML) SYRINGE FOR IV PUSH (FOR BLOOD PRESSURE SUPPORT)
PREFILLED_SYRINGE | INTRAVENOUS | Status: AC
  Filled 2017-09-14: qty 10

## 2017-09-14 MED ORDER — PHENOL 1.4 % MT LIQD: 1.0000 | OROMUCOSAL | Status: DC | PRN

## 2017-09-14 MED ORDER — FENTANYL CITRATE (PF) 100 MCG/2ML IJ SOLN: 25.0000 ug | INTRAMUSCULAR | Status: DC | PRN

## 2017-09-14 MED ORDER — VITAMIN D 1000 UNITS PO TABS
2000.0000 [IU] | ORAL_TABLET | Freq: Every day | ORAL | Status: DC
  Administered 2017-09-15 – 2017-09-16 (×2): 2000 [IU] via ORAL
  Filled 2017-09-14 (×6): qty 2

## 2017-09-14 MED ORDER — OXYCODONE HCL 5 MG PO TABS
5.0000 mg | ORAL_TABLET | ORAL | Status: DC | PRN
  Administered 2017-09-15 – 2017-09-16 (×4): 10 mg via ORAL
  Filled 2017-09-14 (×2): qty 2
  Filled 2017-09-14 (×2): qty 1
  Filled 2017-09-14 (×2): qty 2

## 2017-09-14 MED ORDER — GLYCOPYRROLATE 0.2 MG/ML IJ SOLN
INTRAMUSCULAR | Status: DC | PRN
  Administered 2017-09-14: 0.2 mg via INTRAVENOUS

## 2017-09-14 MED ORDER — PROPOFOL 10 MG/ML IV BOLUS
INTRAVENOUS | Status: DC | PRN
  Administered 2017-09-14: 20 mg via INTRAVENOUS
  Administered 2017-09-14: 100 mg via INTRAVENOUS

## 2017-09-14 MED ORDER — BUPIVACAINE IN DEXTROSE 0.75-8.25 % IT SOLN
INTRATHECAL | Status: DC | PRN
  Administered 2017-09-14: 1.6 mL via INTRATHECAL

## 2017-09-14 MED ORDER — PROPOFOL 10 MG/ML IV BOLUS
INTRAVENOUS | Status: AC
  Filled 2017-09-14: qty 20

## 2017-09-14 MED ORDER — PHENYLEPHRINE HCL 10 MG/ML IJ SOLN
INTRAMUSCULAR | Status: DC | PRN
  Administered 2017-09-14: 120 ug via INTRAVENOUS

## 2017-09-14 MED ORDER — FENTANYL CITRATE (PF) 250 MCG/5ML IJ SOLN
INTRAMUSCULAR | Status: AC
  Filled 2017-09-14: qty 5

## 2017-09-14 MED ORDER — HYDROMORPHONE HCL 2 MG/ML IJ SOLN: 0.5000 mg | INTRAMUSCULAR | Status: DC | PRN

## 2017-09-14 MED ORDER — SODIUM CHLORIDE 0.9 % IR SOLN
Status: DC | PRN
  Administered 2017-09-14: 3000 mL

## 2017-09-14 MED ORDER — BUPIVACAINE-EPINEPHRINE 0.5% -1:200000 IJ SOLN
INTRAMUSCULAR | Status: DC | PRN
  Administered 2017-09-14: 50 mL

## 2017-09-14 MED ORDER — TOBRAMYCIN SULFATE 1.2 G IJ SOLR
INTRAMUSCULAR | Status: AC
  Filled 2017-09-14: qty 1.2

## 2017-09-14 MED ORDER — TOBRAMYCIN SULFATE 1.2 G IJ SOLR
INTRAMUSCULAR | Status: DC | PRN
  Administered 2017-09-14: 1.2 g

## 2017-09-14 MED ORDER — LIDOCAINE 2% (20 MG/ML) 5 ML SYRINGE
INTRAMUSCULAR | Status: AC
  Filled 2017-09-14: qty 5

## 2017-09-14 MED ORDER — DEXTROSE 5 % IV SOLN
INTRAVENOUS | Status: DC | PRN
  Administered 2017-09-14: 25 ug/min via INTRAVENOUS

## 2017-09-14 MED ORDER — VANCOMYCIN HCL IN DEXTROSE 1-5 GM/200ML-% IV SOLN
1000.0000 mg | Freq: Two times a day (BID) | INTRAVENOUS | Status: AC
  Administered 2017-09-14: 1000 mg via INTRAVENOUS
  Filled 2017-09-14: qty 200

## 2017-09-14 SURGICAL SUPPLY — 76 items
BANDAGE ESMARK 6X9 LF (GAUZE/BANDAGES/DRESSINGS) ×1 IMPLANT
BENZOIN TINCTURE PRP APPL 2/3 (GAUZE/BANDAGES/DRESSINGS) ×3 IMPLANT
BLADE SAGITTAL 25.0X1.19X90 (BLADE) ×2 IMPLANT
BLADE SAGITTAL 25.0X1.19X90MM (BLADE) ×1
BLADE SAW SGTL 13X75X1.27 (BLADE) ×3 IMPLANT
BLADE SURG 10 STRL SS (BLADE) ×6 IMPLANT
BNDG ELASTIC 6X10 VLCR STRL LF (GAUZE/BANDAGES/DRESSINGS) ×3 IMPLANT
BNDG ELASTIC 6X15 VLCR STRL LF (GAUZE/BANDAGES/DRESSINGS) ×3 IMPLANT
BNDG ESMARK 6X9 LF (GAUZE/BANDAGES/DRESSINGS) ×3
BOWL SMART MIX CTS (DISPOSABLE) ×3 IMPLANT
CAPT KNEE TOTAL 3 ATTUNE ×3 IMPLANT
CEMENT HV SMART SET (Cement) ×6 IMPLANT
CLOSURE WOUND 1/2 X4 (GAUZE/BANDAGES/DRESSINGS) ×1
COVER SURGICAL LIGHT HANDLE (MISCELLANEOUS) ×3 IMPLANT
CUFF TOURNIQUET SINGLE 34IN LL (TOURNIQUET CUFF) ×3 IMPLANT
CUFF TOURNIQUET SINGLE 44IN (TOURNIQUET CUFF) IMPLANT
DECANTER SPIKE VIAL GLASS SM (MISCELLANEOUS) ×3 IMPLANT
DRAPE EXTREMITY T 121X128X90 (DRAPE) ×3 IMPLANT
DRAPE HALF SHEET 40X57 (DRAPES) ×6 IMPLANT
DRAPE INCISE IOBAN 66X45 STRL (DRAPES) IMPLANT
DRAPE ORTHO SPLIT 77X108 STRL (DRAPES) ×2
DRAPE SURG ORHT 6 SPLT 77X108 (DRAPES) ×1 IMPLANT
DRAPE U-SHAPE 47X51 STRL (DRAPES) ×3 IMPLANT
DRSG AQUACEL AG ADV 3.5X10 (GAUZE/BANDAGES/DRESSINGS) ×3 IMPLANT
DURAPREP 26ML APPLICATOR (WOUND CARE) ×3 IMPLANT
ELECT CAUTERY BLADE 6.4 (BLADE) ×3 IMPLANT
ELECT REM PT RETURN 9FT ADLT (ELECTROSURGICAL) ×3
ELECTRODE REM PT RTRN 9FT ADLT (ELECTROSURGICAL) ×1 IMPLANT
FACESHIELD WRAPAROUND (MASK) ×6 IMPLANT
GAUZE SPONGE 4X4 12PLY STRL LF (GAUZE/BANDAGES/DRESSINGS) ×3 IMPLANT
GAUZE XEROFORM 5X9 LF (GAUZE/BANDAGES/DRESSINGS) ×3 IMPLANT
GLOVE BIO SURGEON STRL SZ7 (GLOVE) ×3 IMPLANT
GLOVE BIOGEL PI IND STRL 7.0 (GLOVE) ×1 IMPLANT
GLOVE BIOGEL PI IND STRL 7.5 (GLOVE) ×1 IMPLANT
GLOVE BIOGEL PI INDICATOR 7.0 (GLOVE) ×2
GLOVE BIOGEL PI INDICATOR 7.5 (GLOVE) ×2
GLOVE SS BIOGEL STRL SZ 7.5 (GLOVE) ×1 IMPLANT
GLOVE SUPERSENSE BIOGEL SZ 7.5 (GLOVE) ×2
GOWN STRL REUS W/ TWL LRG LVL3 (GOWN DISPOSABLE) ×1 IMPLANT
GOWN STRL REUS W/ TWL XL LVL3 (GOWN DISPOSABLE) ×1 IMPLANT
GOWN STRL REUS W/TWL LRG LVL3 (GOWN DISPOSABLE) ×2
GOWN STRL REUS W/TWL XL LVL3 (GOWN DISPOSABLE) ×2
HANDPIECE INTERPULSE COAX TIP (DISPOSABLE) ×2
HOOD PEEL AWAY FACE SHEILD DIS (HOOD) ×6 IMPLANT
IMMOBILIZER KNEE 22 UNIV (SOFTGOODS) ×3 IMPLANT
KIT BASIN OR (CUSTOM PROCEDURE TRAY) ×3 IMPLANT
KIT TURNOVER KIT B (KITS) ×3 IMPLANT
MANIFOLD NEPTUNE II (INSTRUMENTS) ×3 IMPLANT
MARKER SKIN DUAL TIP RULER LAB (MISCELLANEOUS) ×3 IMPLANT
NEEDLE HYPO 22GX1.5 SAFETY (NEEDLE) ×6 IMPLANT
NS IRRIG 1000ML POUR BTL (IV SOLUTION) ×3 IMPLANT
PACK TOTAL JOINT (CUSTOM PROCEDURE TRAY) ×3 IMPLANT
PAD ABD 8X10 STRL (GAUZE/BANDAGES/DRESSINGS) ×6 IMPLANT
PAD ARMBOARD 7.5X6 YLW CONV (MISCELLANEOUS) ×6 IMPLANT
PADDING CAST ABS 4INX4YD NS (CAST SUPPLIES) ×2
PADDING CAST ABS 6INX4YD NS (CAST SUPPLIES) ×2
PADDING CAST ABS COTTON 4X4 ST (CAST SUPPLIES) ×1 IMPLANT
PADDING CAST ABS COTTON 6X4 NS (CAST SUPPLIES) ×1 IMPLANT
SET HNDPC FAN SPRY TIP SCT (DISPOSABLE) ×1 IMPLANT
STRIP CLOSURE SKIN 1/2X4 (GAUZE/BANDAGES/DRESSINGS) ×2 IMPLANT
SUCTION FRAZIER HANDLE 10FR (MISCELLANEOUS) ×2
SUCTION TUBE FRAZIER 10FR DISP (MISCELLANEOUS) ×1 IMPLANT
SUT MNCRL AB 3-0 PS2 18 (SUTURE) ×3 IMPLANT
SUT VIC AB 0 CT1 27 (SUTURE) ×4
SUT VIC AB 0 CT1 27XBRD ANBCTR (SUTURE) ×2 IMPLANT
SUT VIC AB 1 CT1 27 (SUTURE) ×2
SUT VIC AB 1 CT1 27XBRD ANBCTR (SUTURE) ×1 IMPLANT
SUT VIC AB 2-0 CT1 27 (SUTURE) ×4
SUT VIC AB 2-0 CT1 TAPERPNT 27 (SUTURE) ×2 IMPLANT
SYR CONTROL 10ML LL (SYRINGE) ×6 IMPLANT
TOWEL OR 17X24 6PK STRL BLUE (TOWEL DISPOSABLE) ×3 IMPLANT
TOWEL OR 17X26 10 PK STRL BLUE (TOWEL DISPOSABLE) ×3 IMPLANT
TRAY CATH 16FR W/PLASTIC CATH (SET/KITS/TRAYS/PACK) IMPLANT
TRAY FOLEY CATH SILVER 16FR (SET/KITS/TRAYS/PACK) ×3 IMPLANT
WATER STERILE IRR 1000ML POUR (IV SOLUTION) ×3 IMPLANT
Zinacef 1.5g IMPLANT

## 2017-09-14 NOTE — Anesthesia Procedure Notes (Signed)
Anesthesia Regional Block: Adductor canal block   Pre-Anesthetic Checklist: ,, timeout performed, Correct Patient, Correct Site, Correct Laterality, Correct Procedure, Correct Position, site marked, Risks and benefits discussed,  Surgical consent,  Pre-op evaluation,  At surgeon's request and post-op pain management  Laterality: Right  Prep: chloraprep       Needles:  Injection technique: Single-shot  Needle Type: Echogenic Needle     Needle Length: 9cm  Needle Gauge: 21     Additional Needles:   Narrative:  Start time: 09/14/2017 7:08 AM End time: 09/14/2017 7:12 AM Injection made incrementally with aspirations every 5 mL.  Performed by: Personally  Anesthesiologist: Albertha Ghee, MD  Additional Notes: Pt tolerated the procedure well.

## 2017-09-14 NOTE — Transfer of Care (Signed)
Immediate Anesthesia Transfer of Care Note  Patient: Jennifer Tran  Procedure(s) Performed: TOTAL KNEE ARTHROPLASTY (Right Knee)  Patient Location: PACU  Anesthesia Type:Spinal and GA combined with regional for post-op pain  Level of Consciousness: drowsy  Airway & Oxygen Therapy: Patient Spontanous Breathing and Patient connected to nasal cannula oxygen  Post-op Assessment: Report given to RN and Post -op Vital signs reviewed and stable  Post vital signs: Reviewed and stable  Last Vitals:  Vitals Value Taken Time  BP 135/72 09/14/2017  9:33 AM  Temp    Pulse 87 09/14/2017  9:40 AM  Resp 21 09/14/2017  9:40 AM  SpO2 99 % 09/14/2017  9:40 AM  Vitals shown include unvalidated device data.  Last Pain:  Vitals:   09/14/17 0932  TempSrc:   PainSc: 0-No pain      Patients Stated Pain Goal: 7 (68/11/57 2620)  Complications: No apparent anesthesia complications

## 2017-09-14 NOTE — Evaluation (Signed)
Physical Therapy Evaluation Patient Details Name: Jennifer Tran MRN: [id number] DOB: [birth year] Today's Date: 09/14/2017   History of Present Illness  72 yo admitted for Rt TKA. PMHx: OA, anxiety, GERD  Clinical Impression  Pt pleasant and agreeable to PT today. Pt reports 0/10 pain at rest and 2/10 pain at lateral knee with activity. Pt demonstrates expected R knee ROM and strength deficits that result in decreased functional mobility compared to baseline. Pt would benefit from acute PT in order to address above impairments and increase level of independence. Pt given HEP handout and tolerated TherEx well. Pt in heel foam at end of session and ice applied to R knee.      Follow Up Recommendations Home health PT    Equipment Recommendations       Recommendations for Other Services       Precautions / Restrictions Precautions Precautions: Knee Required Braces or Orthoses: Knee Immobilizer - Right Knee Immobilizer - Right: On when out of bed or walking      Mobility  Bed Mobility Overal bed mobility: Modified Independent             General bed mobility comments: pt with KI on and able to swing legs to EOB without assist  Transfers Overall transfer level: Needs assistance   Transfers: Sit to/from Stand Sit to Stand: Min guard         General transfer comment: cues for hand and RLE placement   Ambulation/Gait Ambulation/Gait assistance: Min guard Ambulation Distance (Feet): 100 Feet Assistive device: Rolling walker (2 wheeled) Gait Pattern/deviations: Step-to pattern;Decreased stance time - right   Gait velocity interpretation: <1.31 ft/sec, indicative of household ambulator General Gait Details: cues for sequence, RW use, safety   Stairs            Wheelchair Mobility    Modified Rankin (Stroke Patients Only)       Balance Overall balance assessment: No apparent balance deficits (not formally assessed)                                            Pertinent Vitals/Pain Pain Assessment: No/denies pain    Home Living Family/patient expects to be discharged to:: Private residence Living Arrangements: Spouse/significant other Available Help at Discharge: Available 24 hours/day;Personal care attendant(Spouse has 24 hour nursing care) Type of Home: House(townhouse) Home Access: Ramped entrance     Home Layout: One level;Able to live on main level with bedroom/bathroom Home Equipment: Gilford Rile - 2 wheels;Cane - single point      Prior Function Level of Independence: Independent with assistive device(s)         Comments: Walker or cane     Hand Dominance        Extremity/Trunk Assessment   Upper Extremity Assessment Upper Extremity Assessment: Overall WFL for tasks assessed    Lower Extremity Assessment Lower Extremity Assessment: RLE deficits/detail RLE Deficits / Details: decreased ROM and strength as expected post op    Cervical / Trunk Assessment Cervical / Trunk Assessment: Normal  Communication   Communication: No difficulties  Cognition Arousal/Alertness: Awake/alert Behavior During Therapy: WFL for tasks assessed/performed Overall Cognitive Status: Within Functional Limits for tasks assessed  General Comments      Exercises Total Joint Exercises Ankle Circles/Pumps: AROM;5 reps;Right;Supine Quad Sets: AROM;5 reps;Right;Supine Heel Slides: AROM;5 reps;Right;Supine Straight Leg Raises: AROM;5 reps;Right;Supine   Assessment/Plan    PT Assessment Patient needs continued PT services  PT Problem List Decreased strength;Decreased mobility;Decreased range of motion;Decreased activity tolerance;Decreased balance;Decreased knowledge of use of DME;Decreased knowledge of precautions       PT Treatment Interventions DME instruction;Therapeutic activities;Gait training;Therapeutic exercise;Patient/family education;Functional mobility  training    PT Goals (Current goals can be found in the Care Plan section)  Acute Rehab PT Goals Patient Stated Goal: walk without pain PT Goal Formulation: With patient/family Time For Goal Achievement: 09/21/17 Potential to Achieve Goals: Good    Frequency 7X/week   Barriers to discharge        Co-evaluation               AM-PAC PT "6 Clicks" Daily Activity  Outcome Measure Difficulty turning over in bed (including adjusting bedclothes, sheets and blankets)?: A Little Difficulty moving from lying on back to sitting on the side of the bed? : A Little Difficulty sitting down on and standing up from a chair with arms (e.g., wheelchair, bedside commode, etc,.)?: A Little Help needed moving to and from a bed to chair (including a wheelchair)?: A Little Help needed walking in hospital room?: A Little Help needed climbing 3-5 steps with a railing? : A Little 6 Click Score: 18    End of Session Equipment Utilized During Treatment: Gait belt;Right knee immobilizer Activity Tolerance: Patient tolerated treatment well Patient left: in chair;with call bell/phone within reach;with family/visitor present Nurse Communication: Mobility status PT Visit Diagnosis: Other abnormalities of gait and mobility (R26.89);Muscle weakness (generalized) (M62.81);Difficulty in walking, not elsewhere classified (R26.2)    Time: 1330-1405 PT Time Calculation (min) (ACUTE ONLY): 35 min   Charges:   PT Evaluation $PT Eval Moderate Complexity: 1 Mod PT Treatments $Gait Training: 8-22 mins   PT G Codes:        Gabe Samariya Rockhold, SPT  [employer] 09/14/2017, 2:32 PM

## 2017-09-14 NOTE — Op Note (Signed)
MRN:     [id number] DOB/AGE:    Aug 27, 1945 / 72 y.o.       OPERATIVE REPORT   DATE OF PROCEDURE:  09/14/2017      PREOPERATIVE DIAGNOSIS:   Primary Localized Osteoarthritis right Knee       Estimated body mass index is 31.31 kg/m as calculated from the following:   Height as of this encounter: 5\' 4"  (1.626 m).   Weight as of this encounter: 82.7 kg (182 lb 6.4 oz).                                                       POSTOPERATIVE DIAGNOSIS:   Same                                                                 PROCEDURE:  Procedure(s): TOTAL KNEE ARTHROPLASTY Using Depuy Attune RP implants #5 narrow Femur, #5Tibia, 60mm  RP bearing, 32 Patella    SURGEON: Tailer Volkert A. Noemi Chapel, MD   ASSISTANT: Matthew Saras, PA-C, present and scrubbed throughout the case, critical for retraction, instrumentation, and closure.  ANESTHESIA: Spinal and GET with Adductor Nerve Block  TOURNIQUET TIME: 65 minutes   COMPLICATIONS:  None       SPECIMENS: None   INDICATIONS FOR PROCEDURE: The patient has djd of the knee with varus deformities, XR shows bone on bone arthritis. Patient has failed all conservative measures including anti-inflammatory medicines, narcotics, attempts at exercise and weight loss, cortisone injections and viscosupplementation.  Risks and benefits of surgery have been discussed, questions answered.    DESCRIPTION OF PROCEDURE: The patient identified by armband, received right adductor canal block and IV antibiotics, in the holding area at [facility]. Patient taken to the operating room, appropriate anesthetic monitors were attached. Spinal anesthesia induced with the patient in supine position, Foley catheter was inserted. She was converted to GET after the spinal because of stridor and this was done without complications and she remained stable. Tourniquet applied high to the operative thigh. Lateral post and foot positioner applied to the table, the lower extremity was then  prepped and draped in usual sterile fashion from the ankle to the tourniquet. Time-out procedure was performed. The limb was wrapped with an Esmarch bandage and the tourniquet inflated to 365 mmHg.   We began the operation by making a 6cm anterior midline incision. Small bleeders in the skin and the subcutaneous tissue identified and cauterized. Transverse retinaculum was incised and reflected medially and a medial parapatellar arthrotomy was accomplished. the patella was everted and theprepatellar fat pad resected. The superficial medial collateral ligament was then elevated from anterior to posterior along the proximal flare of the tibia and anterior half of the menisci resected. The knee was hyperflexed exposing bone on bone arthritis. Peripheral and notch osteophytes as well as the cruciate ligaments were then resected. We continued to work our way around posteriorly along the proximal tibia, and externally rotated the tibia subluxing it out from underneath the femur. A McHale retractor was placed through the notch and a lateral Hohmann retractor placed, and an external tibial guide was  placed.  The tibial cutting guide was pinned into place allowing resection of 4 mm of bone medially and about 6 mm of bone laterally because of her varus deformity.   Satisfied with the tibial resection, we then entered the distal femur 2 mm anterior to the PCL origin with the intramedullary guide rod and applied the distal femoral cutting guide set at 48mm, with 5 degrees of valgus. This was pinned along the epicondylar axis. At this point, the distal femoral cut was accomplished without difficulty. We then sized for a 5 narrow femoral component and pinned the guide in 3 degrees of external rotation.The chamfer cutting guide was pinned into place. The anterior, posterior, and chamfer cuts were accomplished without difficulty followed by the  RP box cutting guide and the box cut. We also removed posterior osteophytes from the  posterior femoral condyles. At this time, the knee was brought into full extension. We checked our extension and flexion gaps and found them symmetric at 7.  The patella thickness measured at 27m m. We set the cutting guide at 15 and removed the posterior patella sized for 32 button and drilled the lollipop. The knee was then once again hyperflexed exposing the proximal tibia. We sized for a # 5 tibial base plate, applied the smokestack and the conical reamer followed by the the Delta fin keel punch. We then hammered into place the  RP trial femoral component, inserted a trial bearing, trial patellar button, and took the knee through range of motion from 0-130 degrees. No thumb pressure was required for patellar tracking.   At this point, all trial components were removed, a double batch of DePuy HV cement with tobramycine 1200mg   was mixed and applied to all bony metallic mating surfaces. In order, we hammered into place the tibial tray and removed excess cement, the femoral component and removed excess cement, a 7 mm  RP bearing was inserted, and the knee brought to full extension with compression. The patellar button was clamped into place, and excess cement removed. While the cement cured the wound was irrigated out with normal saline solution pulse lavage, and exparel was injected throughout the knee. Ligament stability and patellar tracking were checked and found to be excellent..   The parapatellar arthrotomy was closed with  #1 Vicryl suture. The subcutaneous tissue with 0 and 2-0 undyed Vicryl suture, and 4-0 Monocryl.. A dressing of Aquaseal, 4 x 4, dressing sponges, Webril, and Ace wrap applied. Needle and sponge count were correct times 2.The patient awakened, extubated, and taken to recovery room without difficulty. Vascular status was normal, pulses 2+ and symmetric.    [name] 07/20/2017, 8:56 AM

## 2017-09-14 NOTE — Anesthesia Procedure Notes (Signed)
Procedure Name: Intubation Date/Time: 09/14/2017 7:51 AM Performed by: Vienna Folden T, CRNA Pre-anesthesia Checklist: Patient identified, Emergency Drugs available, Suction available and Patient being monitored Patient Re-evaluated:Patient Re-evaluated prior to induction Oxygen Delivery Method: Circle system utilized Preoxygenation: Pre-oxygenation with 100% oxygen Induction Type: IV induction and Rapid sequence Ventilation: Mask ventilation with difficulty and Nasal airway inserted- appropriate to patient size Laryngoscope Size: Mac and 3 Grade View: Grade I Tube type: Oral Tube size: 7.0 mm Number of attempts: 1 Airway Equipment and Method: Patient positioned with wedge pillow and Stylet Placement Confirmation: ETT inserted through vocal cords under direct vision,  positive ETCO2 and breath sounds checked- equal and bilateral Secured at: 22 cm Tube secured with: Tape Dental Injury: Teeth and Oropharynx as per pre-operative assessment

## 2017-09-14 NOTE — Anesthesia Procedure Notes (Signed)
Spinal  Patient location during procedure: OR Start time: 09/14/2017 7:22 AM End time: 09/14/2017 7:24 AM Staffing Anesthesiologist: Albertha Ghee, MD Performed: anesthesiologist  Preanesthetic Checklist Completed: patient identified, surgical consent, pre-op evaluation, timeout performed, IV checked, risks and benefits discussed and monitors and equipment checked Spinal Block Patient position: sitting Prep: DuraPrep Patient monitoring: cardiac monitor, continuous pulse ox and blood pressure Approach: midline Location: L3-4 Injection technique: single-shot Needle Needle type: Pencan  Needle gauge: 24 G Needle length: 9 cm Assessment Sensory level: T10 Additional Notes Functioning IV was confirmed and monitors were applied. Sterile prep and drape, including hand hygiene and sterile gloves were used. The patient was positioned and the spine was prepped. The skin was anesthetized with lidocaine.  Free flow of clear CSF was obtained prior to injecting local anesthetic into the CSF.  The spinal needle aspirated freely following injection.  The needle was carefully withdrawn.  The patient tolerated the procedure well.

## 2017-09-14 NOTE — Progress Notes (Signed)
Orthopedic Tech Progress Note Patient Details:  Jennifer Tran [birth year] [id number]  CPM Right Knee CPM Right Knee: On Right Knee Flexion (Degrees): 90 Right Knee Extension (Degrees): 0 Additional Comments: trapeze bar patient helper  Post Interventions Patient Tolerated: Well Instructions Provided: Care of device  Hildred Priest 09/14/2017, 10:09 AM

## 2017-09-14 NOTE — Anesthesia Preprocedure Evaluation (Signed)
Anesthesia Evaluation  Patient identified by MRN, date of birth, ID band Patient awake    Reviewed: Allergy & Precautions, H&P , NPO status , Patient's Chart, lab work & pertinent test results  Airway Mallampati: II   Neck ROM: full    Dental   Pulmonary former smoker,    breath sounds clear to auscultation       Cardiovascular hypertension,  Rhythm:regular Rate:Normal     Neuro/Psych PSYCHIATRIC DISORDERS Anxiety Depression    GI/Hepatic GERD  ,  Endo/Other    Renal/GU      Musculoskeletal  (+) Arthritis ,   Abdominal   Peds  Hematology   Anesthesia Other Findings   Reproductive/Obstetrics                             Anesthesia Physical Anesthesia Plan  ASA: II  Anesthesia Plan: Spinal   Post-op Pain Management:  Regional for Post-op pain   Induction: Intravenous  PONV Risk Score and Plan: 2 and Ondansetron, Dexamethasone, Propofol infusion and Treatment may vary due to age or medical condition  Airway Management Planned: Natural Airway  Additional Equipment:   Intra-op Plan:   Post-operative Plan:   Informed Consent: I have reviewed the patients History and Physical, chart, labs and discussed the procedure including the risks, benefits and alternatives for the proposed anesthesia with the patient or authorized representative who has indicated his/her understanding and acceptance.     Plan Discussed with: CRNA, Anesthesiologist and Surgeon  Anesthesia Plan Comments:         Anesthesia Quick Evaluation

## 2017-09-14 NOTE — Plan of Care (Signed)
  Problem: Education: Goal: Knowledge of General Education information will improve Outcome: Progressing   

## 2017-09-14 NOTE — Care Management Note (Signed)
Case Management Note  Patient Details  Name: Jennifer Tran MRN: [id number] Date of Birth: [birth year]  Subjective/Objective:    Right TKA                Action/Plan: NCM spoke to pt and dtr, Caryl Pina at bedside. States she will have CPM at home. She will need RW and 3n1. Offered choice for HH/list provided. States she HH arranged with [facility].   Expected Discharge Date:              Expected Discharge Plan:  [place]  In-House Referral:  NA  Discharge planning Services  CM Consult  Post Acute Care Choice:  Home Health Choice offered to:  Patient  DME Arranged:  3-N-1, Walker rolling, CPM DME Agency:     HH Arranged:  PT HH Agency:  [facility]  Status of Service:  In process, will continue to follow  If discussed at Long Length of Stay Meetings, dates discussed:    Additional Comments:  Erenest Rasher, RN 09/14/2017, 11:42 AM

## 2017-09-14 NOTE — Interval H&P Note (Signed)
History and Physical Interval Note:  09/14/2017 7:00 AM  Jennifer Tran  has presented today for surgery, with the diagnosis of oa right knee  The various methods of treatment have been discussed with the patient and family. After consideration of risks, benefits and other options for treatment, the patient has consented to  Procedure(s): TOTAL KNEE ARTHROPLASTY (Right) as a surgical intervention .  The patient's history has been reviewed, patient examined, no change in status, stable for surgery.  I have reviewed the patient's chart and labs.  Questions were answered to the patient's satisfaction.     [name]

## 2017-09-15 ENCOUNTER — Encounter (HOSPITAL_COMMUNITY): Payer: Self-pay | Admitting: Physician Assistant

## 2017-09-15 ENCOUNTER — Inpatient Hospital Stay (HOSPITAL_COMMUNITY): Payer: Medicare Other

## 2017-09-15 DIAGNOSIS — D72829 Elevated white blood cell count, unspecified: Secondary | ICD-10-CM

## 2017-09-15 DIAGNOSIS — Z22321 Carrier or suspected carrier of Methicillin susceptible Staphylococcus aureus: Secondary | ICD-10-CM

## 2017-09-15 HISTORY — DX: Carrier or suspected carrier of methicillin susceptible Staphylococcus aureus: Z22.321

## 2017-09-15 HISTORY — DX: Elevated white blood cell count, unspecified: D72.829

## 2017-09-15 LAB — CBC
HCT: 33.5 % — ABNORMAL LOW (ref 36.0–46.0)
HEMOGLOBIN: 10.9 g/dL — AB (ref 12.0–15.0)
MCH: 28.7 pg (ref 26.0–34.0)
MCHC: 32.5 g/dL (ref 30.0–36.0)
MCV: 88.2 fL (ref 78.0–100.0)
PLATELETS: 275 10*3/uL (ref 150–400)
RBC: 3.8 MIL/uL — AB (ref 3.87–5.11)
RDW: 12.2 % (ref 11.5–15.5)
WBC: 19.3 10*3/uL — AB (ref 4.0–10.5)

## 2017-09-15 LAB — BASIC METABOLIC PANEL
ANION GAP: 6 (ref 5–15)
BUN: 12 mg/dL (ref 6–20)
CHLORIDE: 109 mmol/L (ref 101–111)
CO2: 27 mmol/L (ref 22–32)
Calcium: 8.8 mg/dL — ABNORMAL LOW (ref 8.9–10.3)
Creatinine, Ser: 0.91 mg/dL (ref 0.44–1.00)
GFR calc Af Amer: >60 mL/min (ref >60)
GFR calc non Af Amer: >60 mL/min (ref >60)
Glucose, Bld: 144 mg/dL — ABNORMAL HIGH (ref 65–99)
POTASSIUM: 4.1 mmol/L (ref 3.5–5.1)
SODIUM: 142 mmol/L (ref 135–145)

## 2017-09-15 MED ORDER — CEFAZOLIN SODIUM-DEXTROSE 2-4 GM/100ML-% IV SOLN
2.0000 g | Freq: Three times a day (TID) | INTRAVENOUS | Status: DC
  Administered 2017-09-15 – 2017-09-16 (×5): 2 g via INTRAVENOUS
  Filled 2017-09-15 (×5): qty 100

## 2017-09-15 MED ORDER — VANCOMYCIN HCL IN DEXTROSE 1-5 GM/200ML-% IV SOLN: 1000.0000 mg | Freq: Two times a day (BID) | INTRAVENOUS | Status: DC

## 2017-09-15 NOTE — Progress Notes (Signed)
Physical Therapy Treatment Patient Details Name: Jennifer Tran MRN: [id number] DOB: December 31, 1945 Today's Date: 09/15/2017    History of Present Illness 72 yo admitted for Rt TKA. PMHx: OA, anxiety, GERD    PT Comments    Pt performed gait and therapeutic exercises this am.  Pt tolerated session well and requested pain meds during session as pain began to increase.  Pt is on track to return home with support from her daughter.     Follow Up Recommendations  Follow surgeon's recommendation for DC plan and follow-up therapies     Equipment Recommendations       Recommendations for Other Services       Precautions / Restrictions Precautions Precautions: Knee Required Braces or Orthoses: Knee Immobilizer - Right Knee Immobilizer - Right: On when out of bed or walking    Mobility  Bed Mobility               General bed mobility comments: Pt oob on arrival in bathroom finishing her bath.    Transfers Overall transfer level: Needs assistance Equipment used: Rolling walker (2 wheeled) Transfers: Sit to/from Stand Sit to Stand: Min guard         General transfer comment: cues for hand and RLE placement   Ambulation/Gait Ambulation/Gait assistance: Min guard Ambulation Distance (Feet): 120 Feet Assistive device: Rolling walker (2 wheeled) Gait Pattern/deviations: Decreased stance time - right;Step-through pattern;Decreased stride length;Decreased dorsiflexion - right     General Gait Details: cues for sequence, RW use, safety.  Cues for R heel strike pre stance phase.     Stairs             Wheelchair Mobility    Modified Rankin (Stroke Patients Only)       Balance Overall balance assessment: Mild deficits observed, not formally tested                                          Cognition Arousal/Alertness: Awake/alert Behavior During Therapy: WFL for tasks assessed/performed Overall Cognitive Status: Within Functional Limits for  tasks assessed                                        Exercises Total Joint Exercises Ankle Circles/Pumps: AROM;Supine;Both;20 reps Quad Sets: AROM;Right;10 reps;Supine Towel Squeeze: AROM;Both;10 reps;Supine Short Arc Quad: AROM;Right;10 reps;Supine Heel Slides: AROM;Right;10 reps;Supine Hip ABduction/ADduction: AROM;Right;10 reps;Supine Straight Leg Raises: AROM;Right;10 reps;Supine Goniometric ROM: 97 degrees flexion in R knee AROM.      General Comments        Pertinent Vitals/Pain Pain Assessment: No/denies pain    Home Living                      Prior Function            PT Goals (current goals can now be found in the care plan section) Acute Rehab PT Goals Patient Stated Goal: walk without pain Potential to Achieve Goals: Good Progress towards PT goals: Progressing toward goals    Frequency    7X/week      PT Plan Current plan remains appropriate    Co-evaluation              AM-PAC PT "6 Clicks" Daily Activity  Outcome Measure  Difficulty turning over in bed (  including adjusting bedclothes, sheets and blankets)?: A Little Difficulty moving from lying on back to sitting on the side of the bed? : A Little Difficulty sitting down on and standing up from a chair with arms (e.g., wheelchair, bedside commode, etc,.)?: A Little Help needed moving to and from a bed to chair (including a wheelchair)?: A Little Help needed walking in hospital room?: A Little Help needed climbing 3-5 steps with a railing? : A Little 6 Click Score: 18    End of Session Equipment Utilized During Treatment: Gait belt Activity Tolerance: Patient tolerated treatment well Patient left: in chair;with call bell/phone within reach;with family/visitor present Nurse Communication: Mobility status PT Visit Diagnosis: Other abnormalities of gait and mobility (R26.89);Muscle weakness (generalized) (M62.81);Difficulty in walking, not elsewhere classified  (R26.2)     Time: 6144-3154 PT Time Calculation (min) (ACUTE ONLY): 37 min  Charges:  $Gait Training: 8-22 mins $Therapeutic Exercise: 8-22 mins                    G Codes:       Governor Rooks, PTA pager [phone]    Cristela Blue 09/15/2017, 11:37 AM

## 2017-09-15 NOTE — Progress Notes (Addendum)
Subjective: 1 Day Post-Op Procedure(s) (LRB): TOTAL KNEE ARTHROPLASTY (Right) Patient reports pain as 5 on 0-10 scale.    Objective: Vital signs in last 24 hours: Temp:  [97.5 F (36.4 C)-99 F (37.2 C)] 98 F (36.7 C) (05/21 0500) Pulse Rate:  [76-84] 84 (05/21 0500) Resp:  [15-18] 17 (05/21 0500) BP: (114-139)/(59-75) 118/62 (05/21 0500) SpO2:  [91 %-96 %] 96 % (05/21 0500)  Intake/Output from previous day: 05/20 0701 - 05/21 0700 In: 1540 [P.O.:240; I.V.:1300] Out: 3450 [Urine:3250; Blood:200] Intake/Output this shift: No intake/output data recorded.  Recent Labs    09/15/17 0832  HGB 10.9*   Recent Labs    09/15/17 0832  WBC 19.3*  RBC 3.80*  HCT 33.5*  PLT 275   No results for input(s): NA, K, CL, CO2, BUN, CREATININE, GLUCOSE, CALCIUM in the last 72 hours. No results for input(s): LABPT, INR in the last 72 hours.  ABD soft Neurovascular intact Sensation intact distally Intact pulses distally Dorsiflexion/Plantar flexion intact Incision: moderate drainage  Anticipated LOS equal to or greater than 2 midnights due to - Age 59 and older with one or more of the following:  - Obesity  - Expected need for hospital services (PT, OT, Nursing) required for safe  discharge  - Anticipated need for postoperative skilled nursing care or inpatient rehab  - Active co-morbidities: None OR   - Unanticipated findings during/Post Surgery: Slow post-op progression: GI, pain control, mobility  -    Assessment/Plan: 1 Day Post-Op Procedure(s) (LRB): TOTAL KNEE ARTHROPLASTY (Right)  Principal Problem:   Primary localized osteoarthritis of right knee Active Problems:   Anxiety   GERD (gastroesophageal reflux disease)   Leukocytosis   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  Will start Ancef 2 grams q 8 hrs in the setting of MSSA positive and WBC  greater than 19.    Jennifer Tran 09/15/2017, 9:46 AM

## 2017-09-15 NOTE — Care Plan (Signed)
Patient was seen prior to surgery in office. She will discharge to home with HHPT provided by [facility] and her family. She has all needed equipment at home. Her CPM will be delivered by [employer].  She is scheduled to follow up with Dr. Noemi Chapel in the office on 09/28/17 @ 430. She will begin OPPT prior to the appointment at [facility].   Please contact Ladell Heads, [place] with any questions or if this plan needs to change.   Thanks

## 2017-09-15 NOTE — Progress Notes (Addendum)
Physical Therapy Treatment Patient Details Name: Jennifer Tran MRN: [id number] DOB: [birth year] Today's Date: 09/15/2017    History of Present Illness 72 yo admitted for Rt TKA. PMHx: OA, anxiety, GERD    PT Comments    Pt continues to progress well during therapy sessions.  Pt able to advance gait but remains to require min guard for gait training and functional mobility.  Pt required cues for safety.  Will plan for stair training in the am.     Follow Up Recommendations  Home health PT;Follow surgeon's recommendation for DC plan and follow-up therapies     Equipment Recommendations  Rolling walker with 5" wheels;3in1 (PT)(will require youth RW if patient is to d/c home.  )    Recommendations for Other Services       Precautions / Restrictions Precautions Precautions: Knee Restrictions Weight Bearing Restrictions: Yes RLE Weight Bearing: Weight bearing as tolerated    Mobility  Bed Mobility Overal bed mobility: Needs Assistance Bed Mobility: Supine to Sit     Supine to sit: Min assist     General bed mobility comments: Assistance to removed RLE from CPM.    Transfers Overall transfer level: Needs assistance Equipment used: Rolling walker (2 wheeled) Transfers: Sit to/from Stand Sit to Stand: Min guard         General transfer comment: cues for hand and RLE placement  Ambulation/Gait Ambulation/Gait assistance: Min guard Ambulation Distance (Feet): 180 Feet Assistive device: Rolling walker (2 wheeled) Gait Pattern/deviations: Decreased stance time - right;Step-through pattern;Decreased stride length;Decreased dorsiflexion - right     General Gait Details: cues for sequence, RW use, safety.  Cues for R heel strike pre stance phase.     Stairs             Wheelchair Mobility    Modified Rankin (Stroke Patients Only)       Balance Overall balance assessment: Mild deficits observed, not formally tested                                           Cognition Arousal/Alertness: Awake/alert Behavior During Therapy: WFL for tasks assessed/performed Overall Cognitive Status: Within Functional Limits for tasks assessed                                        Exercises Total Joint Exercises Ankle Circles/Pumps: AROM;Supine;Both;20 reps Quad Sets: AROM;Right;10 reps;Supine Towel Squeeze: AROM;Both;10 reps;Supine Short Arc Quad: AROM;Right;10 reps;Supine Heel Slides: AROM;Right;10 reps;Supine Hip ABduction/ADduction: AROM;Right;10 reps;Supine Straight Leg Raises: AROM;Right;10 reps;Supine    General Comments        Pertinent Vitals/Pain Pain Assessment: No/denies pain    Home Living                      Prior Function            PT Goals (current goals can now be found in the care plan section) Acute Rehab PT Goals Patient Stated Goal: walk without pain Potential to Achieve Goals: Good Progress towards PT goals: Progressing toward goals    Frequency    7X/week      PT Plan Current plan remains appropriate    Co-evaluation              AM-PAC PT "6 Clicks" Daily Activity  Outcome Measure  Difficulty turning over in bed (including adjusting bedclothes, sheets and blankets)?: A Little Difficulty moving from lying on back to sitting on the side of the bed? : Unable Difficulty sitting down on and standing up from a chair with arms (e.g., wheelchair, bedside commode, etc,.)?: Unable Help needed moving to and from a bed to chair (including a wheelchair)?: A Little Help needed walking in hospital room?: A Little Help needed climbing 3-5 steps with a railing? : A Little 6 Click Score: 14    End of Session Equipment Utilized During Treatment: Gait belt Activity Tolerance: Patient tolerated treatment well Patient left: in chair;with call bell/phone within reach;with family/visitor present Nurse Communication: Mobility status PT Visit Diagnosis: Other abnormalities of  gait and mobility (R26.89);Muscle weakness (generalized) (M62.81);Difficulty in walking, not elsewhere classified (R26.2)     Time: 1532-1600 PT Time Calculation (min) (ACUTE ONLY): 28 min  Charges:  $Gait Training: 8-22 mins $Therapeutic Exercise: 8-22 mins                    G Codes:       Governor Rooks, PTA pager [place] 09/15/2017, 4:17 PM

## 2017-09-15 NOTE — Anesthesia Postprocedure Evaluation (Signed)
Anesthesia Post Note  Patient: Jennifer Tran  Procedure(s) Performed: TOTAL KNEE ARTHROPLASTY (Right Knee)     Patient location during evaluation: PACU Anesthesia Type: General and Spinal Level of consciousness: awake and alert Pain management: pain level controlled Vital Signs Assessment: post-procedure vital signs reviewed and stable Respiratory status: spontaneous breathing, nonlabored ventilation, respiratory function stable and patient connected to nasal cannula oxygen Cardiovascular status: blood pressure returned to baseline and stable Postop Assessment: no apparent nausea or vomiting and spinal receding Anesthetic complications: no    Last Vitals:  Vitals:   09/15/17 0025 09/15/17 0500  BP: 125/73 118/62  Pulse: 79 84  Resp: 17 17  Temp: 37.1 C 36.7 C  SpO2: 96% 96%    Last Pain:  Vitals:   09/15/17 0500  TempSrc: Oral  PainSc:                  [place]

## 2017-09-15 NOTE — Plan of Care (Signed)
  Problem: Education: Goal: Knowledge of General Education information will improve Outcome: Progressing   

## 2017-09-16 LAB — CBC
HCT: 32.9 % — ABNORMAL LOW (ref 36.0–46.0)
Hemoglobin: 10.6 g/dL — ABNORMAL LOW (ref 12.0–15.0)
MCH: 28.9 pg (ref 26.0–34.0)
MCHC: 32.2 g/dL (ref 30.0–36.0)
MCV: 89.6 fL (ref 78.0–100.0)
PLATELETS: 257 10*3/uL (ref 150–400)
RBC: 3.67 MIL/uL — AB (ref 3.87–5.11)
RDW: 12.6 % (ref 11.5–15.5)
WBC: 17.5 10*3/uL — ABNORMAL HIGH (ref 4.0–10.5)

## 2017-09-16 LAB — BASIC METABOLIC PANEL
Anion gap: 6 (ref 5–15)
BUN: 14 mg/dL (ref 6–20)
CO2: 28 mmol/L (ref 22–32)
Calcium: 8.7 mg/dL — ABNORMAL LOW (ref 8.9–10.3)
Chloride: 109 mmol/L (ref 101–111)
Creatinine, Ser: 0.88 mg/dL (ref 0.44–1.00)
GFR calc Af Amer: >60 mL/min (ref >60)
GLUCOSE: 136 mg/dL — AB (ref 65–99)
POTASSIUM: 4.2 mmol/L (ref 3.5–5.1)
Sodium: 143 mmol/L (ref 135–145)

## 2017-09-16 MED ORDER — OXYCODONE HCL 5 MG PO TABS: ORAL_TABLET | ORAL | 0 refills | Status: DC

## 2017-09-16 MED ORDER — POLYETHYLENE GLYCOL 3350 17 G PO PACK: PACK | ORAL | 0 refills | Status: DC

## 2017-09-16 MED ORDER — DOCUSATE SODIUM 100 MG PO CAPS: ORAL_CAPSULE | ORAL | 0 refills | Status: DC

## 2017-09-16 MED ORDER — ASPIRIN 325 MG PO TBEC: DELAYED_RELEASE_TABLET | ORAL | 0 refills | Status: DC

## 2017-09-16 MED ORDER — CEPHALEXIN 500 MG PO CAPS: 500.0000 mg | ORAL_CAPSULE | Freq: Four times a day (QID) | ORAL | 0 refills | Status: AC

## 2017-09-16 NOTE — Progress Notes (Addendum)
Physical Therapy Treatment Patient Details Name: Jennifer Tran MRN: [id number] DOB: 07/03/1945 Today's Date: 09/16/2017    History of Present Illness 72 yo admitted for Rt TKA. PMHx: OA, anxiety, GERD    PT Comments    Pt performed gait training, stair training and review of HEP for accuracy.  Pt remains to present with great knee flexion.  She tolerated stairs well and able to maintain her O2 sats on RA 92%-97%.  Plan for pm session if patient remains hospitalized.    Follow Up Recommendations  Home health PT;Follow surgeon's recommendation for DC plan and follow-up therapies     Equipment Recommendations  Rolling walker with 5" wheels;3in1 (PT)    Recommendations for Other Services       Precautions / Restrictions Precautions Precautions: Knee Restrictions Weight Bearing Restrictions: Yes RLE Weight Bearing: Weight bearing as tolerated    Mobility  Bed Mobility               General bed mobility comments: Pt in recliner on arrival.   Transfers Overall transfer level: Needs assistance Equipment used: Rolling walker (2 wheeled) Transfers: Sit to/from Stand Sit to Stand: Min guard         General transfer comment: cues for hand and RLE placement.    Ambulation/Gait Ambulation/Gait assistance: Min guard Ambulation Distance (Feet): 300 Feet Assistive device: Rolling walker (2 wheeled) Gait Pattern/deviations: Decreased stance time - right;Step-through pattern;Decreased stride length;Decreased dorsiflexion - right     General Gait Details: cues for sequence, RW use, safety.  Cues for R heel strike pre stance phase.     Stairs Stairs: Yes Stairs assistance: Min guard Stair Management: One rail Right;Forwards;Backwards;With walker;No rails;Step to pattern(x3 with R rail and x1 backwards with RW and no rails.  ) Number of Stairs: 4 General stair comments: X 3 with R rail and cues for sequencing.  Pt performed curb trial x1 backwards with RW, cues for  sequencing and RW placement.     Wheelchair Mobility    Modified Rankin (Stroke Patients Only)       Balance Overall balance assessment: Mild deficits observed, not formally tested                                          Cognition Arousal/Alertness: Awake/alert Behavior During Therapy: WFL for tasks assessed/performed Overall Cognitive Status: Within Functional Limits for tasks assessed                                        Exercises Total Joint Exercises Ankle Circles/Pumps: AROM;Supine;Both;20 reps Quad Sets: AROM;Right;10 reps;Supine Towel Squeeze: AROM;Both;10 reps;Supine Short Arc Quad: AROM;Right;10 reps;Supine Heel Slides: AROM;Right;10 reps;Supine Hip ABduction/ADduction: AROM;Right;10 reps;Supine Straight Leg Raises: AROM;Right;10 reps;Supine Long Arc Quad: AROM;Right;10 reps;Seated Goniometric ROM: 97 dgegrees flexion in R knee AROM.      General Comments        Pertinent Vitals/Pain Pain Assessment: 0-10 Pain Score: 6  Pain Descriptors / Indicators: Tightness;Discomfort;Guarding Pain Intervention(s): Monitored during session;Repositioned    Home Living                      Prior Function            PT Goals (current goals can now be found in the care plan section) Acute Rehab  PT Goals Patient Stated Goal: walk without pain Potential to Achieve Goals: Good Progress towards PT goals: Progressing toward goals    Frequency    7X/week      PT Plan Current plan remains appropriate    Co-evaluation              AM-PAC PT "6 Clicks" Daily Activity  Outcome Measure  Difficulty turning over in bed (including adjusting bedclothes, sheets and blankets)?: A Little Difficulty moving from lying on back to sitting on the side of the bed? : Unable Difficulty sitting down on and standing up from a chair with arms (e.g., wheelchair, bedside commode, etc,.)?: Unable Help needed moving to and from a bed  to chair (including a wheelchair)?: A Little Help needed walking in hospital room?: A Little Help needed climbing 3-5 steps with a railing? : A Little 6 Click Score: 14    End of Session Equipment Utilized During Treatment: Gait belt Activity Tolerance: Patient tolerated treatment well Patient left: in chair;with call bell/phone within reach;with family/visitor present Nurse Communication: Mobility status PT Visit Diagnosis: Other abnormalities of gait and mobility (R26.89);Muscle weakness (generalized) (M62.81);Difficulty in walking, not elsewhere classified (R26.2)     Time: 1610-9604 PT Time Calculation (min) (ACUTE ONLY): 27 min  Charges:  $Gait Training: 8-22 mins $Therapeutic Exercise: 8-22 mins                    G Codes:       Governor Rooks, PTA pager [phone]    Cristela Blue 09/16/2017, 2:44 PM

## 2017-09-16 NOTE — Progress Notes (Signed)
Physical Therapy Treatment Patient Details Name: Jennifer Tran MRN: [id number] DOB: 01/01/1946 Today's Date: 09/16/2017    History of Present Illness 72 yo admitted for Rt TKA. PMHx: OA, anxiety, GERD    PT Comments    Pt performed repeated trial of gait and reviewed exercises in her HEP.  Pt awaiting completion of IV antibiotic before d/c home.  Pt with d/c home with support from her in home aide and her daughter.  Equipment present.  Informed nursing that patient is ready to d/c home from a mobility standpoint.    Follow Up Recommendations  Home health PT;Follow surgeon's recommendation for DC plan and follow-up therapies     Equipment Recommendations  Rolling walker with 5" wheels;3in1 (PT)    Recommendations for Other Services       Precautions / Restrictions Precautions Precautions: Knee Restrictions Weight Bearing Restrictions: Yes RLE Weight Bearing: Weight bearing as tolerated    Mobility  Bed Mobility               General bed mobility comments: Pt in recliner on arrival.   Transfers Overall transfer level: Needs assistance Equipment used: Rolling walker (2 wheeled) Transfers: Sit to/from Stand Sit to Stand: Supervision         General transfer comment: cues for hand and RLE placement.    Ambulation/Gait Ambulation/Gait assistance: Supervision Ambulation Distance (Feet): 200 Feet Assistive device: Rolling walker (2 wheeled) Gait Pattern/deviations: Step-through pattern;Trunk flexed     General Gait Details: Cues for upper trunk control.     Stairs   Wheelchair Mobility    Modified Rankin (Stroke Patients Only)       Balance Overall balance assessment: Mild deficits observed, not formally tested                                          Cognition Arousal/Alertness: Awake/alert Behavior During Therapy: WFL for tasks assessed/performed Overall Cognitive Status: Within Functional Limits for tasks assessed                                         Exercises Total Joint Exercises Ankle Circles/Pumps: AROM;Supine;Both;20 reps Quad Sets: AROM;Right;10 reps;Supine Towel Squeeze: AROM;Both;10 reps;Supine Short Arc Quad: AROM;Right;10 reps;Supine Heel Slides: AROM;Right;10 reps;Supine Hip ABduction/ADduction: AROM;Right;10 reps;Supine Straight Leg Raises: AROM;Right;10 reps;Supine Long Arc Quad: AROM;Right;10 reps;Seated     General Comments        Pertinent Vitals/Pain Pain Assessment: 0-10 Pain Score: 7  Pain Descriptors / Indicators: Tightness;Discomfort;Guarding Pain Intervention(s): Monitored during session;Repositioned;Ice applied    Home Living                      Prior Function            PT Goals (current goals can now be found in the care plan section) Acute Rehab PT Goals Patient Stated Goal: walk without pain Potential to Achieve Goals: Good Progress towards PT goals: Progressing toward goals    Frequency    7X/week      PT Plan Current plan remains appropriate    Co-evaluation              AM-PAC PT "6 Clicks" Daily Activity  Outcome Measure  Difficulty turning over in bed (including adjusting bedclothes, sheets and blankets)?: A Little Difficulty moving  from lying on back to sitting on the side of the bed? : A Little Difficulty sitting down on and standing up from a chair with arms (e.g., wheelchair, bedside commode, etc,.)?: A Little Help needed moving to and from a bed to chair (including a wheelchair)?: A Little Help needed walking in hospital room?: A Little Help needed climbing 3-5 steps with a railing? : A Little 6 Click Score: 18    End of Session Equipment Utilized During Treatment: Gait belt Activity Tolerance: Patient tolerated treatment well Patient left: in chair;with call bell/phone within reach;with family/visitor present Nurse Communication: Mobility status PT Visit Diagnosis: Other abnormalities of gait and  mobility (R26.89);Muscle weakness (generalized) (M62.81);Difficulty in walking, not elsewhere classified (R26.2)     Time: 0712-1975 PT Time Calculation (min) (ACUTE ONLY): 19 min  Charges:  $Gait Training: 8-22 mins                     G Codes:       Governor Rooks, PTA pager [phone]    Jennifer Tran 09/16/2017, 2:48 PM

## 2017-09-16 NOTE — Discharge Summary (Signed)
Patient ID: Jennifer Tran MRN: [id number] DOB/AGE: [birth year] 71 y.o.  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 Anxiety   . Arthritis   . Cancer ([facility])    right knee melanoma  1992  . Depression   . GERD (gastroesophageal reflux disease)   . Hypertension   . Leukocytosis 09/15/2017  . 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 Primary localized osteoarthritis of right knee   . Reflux     Surgeries: Procedure(s): TOTAL KNEE ARTHROPLASTY on 09/14/2017   Consultants:   Discharged Condition: Improved  Hospital Course: Jennifer Tran is an 72 y.o. female who was admitted 09/14/2017 for operative treatment ofPrimary localized osteoarthritis of right knee. Patient has severe unremitting pain that affects sleep, daily activities, and work/hobbies. After pre-op clearance the patient was taken to the operating room on 09/14/2017 and underwent  Procedure(s): TOTAL KNEE ARTHROPLASTY.    Patient was given perioperative antibiotics:  Anti-infectives (From admission, onward)   Start     Dose/Rate Route Frequency Ordered Stop   09/16/17 0000  cephALEXin (KEFLEX) 500 MG capsule     500 mg Oral 4 times daily 09/16/17 1419 09/26/17 2359   09/15/17 0945  ceFAZolin (ANCEF) IVPB 2g/100 mL premix     2 g 200 mL/hr over 30 Minutes Intravenous Every 8 hours 09/15/17 0942     09/15/17 0915  vancomycin (VANCOCIN) IVPB 1000 mg/200 mL premix  Status:  Discontinued     1,000 mg 200 mL/hr over 60 Minutes Intravenous Every 12 hours 09/15/17 0912 09/15/17 0942   09/14/17 1800  vancomycin (VANCOCIN) IVPB 1000 mg/200 mL premix     1,000 mg 200 mL/hr over 60 Minutes Intravenous  Every 12 hours 09/14/17 1034 09/14/17 1811   09/14/17 0802  tobramycin (NEBCIN) powder  Status:  Discontinued       As needed 09/14/17 0802 09/14/17 0927   09/14/17 0600  vancomycin (VANCOCIN) IVPB 1000 mg/200 mL premix     1,000 mg 200 mL/hr over 60 Minutes Intravenous To [facility] 09/11/17 1354 09/14/17 0700       Patient was given sequential compression devices, early ambulation, and chemoprophylaxis to prevent DVT.  Patient benefited maximally from hospital stay and there were no complications.    Recent vital signs:  Patient Vitals for the past 24 hrs:  BP Temp Temp src Pulse Resp SpO2  09/16/17 0645 (!) 144/74 98 F (36.7 C) Oral 61 17 96 %  09/15/17 2039 (!) 156/81 98.3 F (36.8 C) Oral 61 16 98 %  09/15/17 1437 125/64 98.5 F (36.9 C) Oral 65 20 98 %     Recent laboratory studies:  Recent Labs    09/15/17 0832 09/16/17 0349  WBC 19.3* 17.5*  HGB 10.9* 10.6*  HCT 33.5* 32.9*  PLT 275 257  NA 142 143  K 4.1 4.2  CL 109 109  CO2 27 28  BUN 12 14  CREATININE 0.91 0.88  GLUCOSE 144* 136*  CALCIUM 8.8* 8.7*     Discharge Medications:   Allergies as of 09/16/2017      Reactions   Ivp Dye [iodinated Diagnostic Agents] Anaphylaxis   Penicillins Rash, Other (See Comments)   PATIENT HAS HAD A PCN REACTION WITH  IMMEDIATE RASH, FACIAL/TONGUE/THROAT SWELLING, SOB, OR LIGHTHEADEDNESS WITH HYPOTENSION:  #  #  YES  #  #  Has patient had a PCN reaction causing severe rash involving mucus membranes or skin necrosis: Unknown Has patient had a PCN reaction that required hospitalization: No Has patient had a PCN reaction occurring within the last 10 years: No   Nsaids Swelling, Other (See Comments)   Ankle swelling      Medication List    STOP taking these medications   vitamin B-12 500 MCG tablet Commonly known as:  CYANOCOBALAMIN     TAKE these medications   acetaminophen 325 MG tablet Commonly known as:  TYLENOL Take 325 mg by mouth every 6 (six) hours  as needed (for pain/headaches.).   ALPRAZolam 0.25 MG tablet Commonly known as:  XANAX Take 0.25 mg by mouth 2 (two) times daily as needed for anxiety.   aspirin 325 MG EC tablet 1 tab a day for the next 30 days to prevent blood clots   cephALEXin 500 MG capsule Commonly known as:  KEFLEX Take 1 capsule (500 mg total) by mouth 4 (four) times daily for 10 days.   docusate sodium 100 MG capsule Commonly known as:  COLACE 1 tab 2 times a day while on narcotics.  STOOL SOFTENER   DULoxetine 30 MG capsule Commonly known as:  CYMBALTA Take 30 mg by mouth daily.   famotidine 10 MG chewable tablet Commonly known as:  PEPCID AC Chew 10 mg by mouth daily.   fluticasone 50 MCG/ACT nasal spray Commonly known as:  FLONASE Place 1 spray into both nostrils daily.   metoprolol succinate 50 MG 24 hr tablet Commonly known as:  TOPROL-XL Take 50 mg by mouth daily. Take with or immediately following a meal.   multivitamin with minerals Tabs tablet Take 1 tablet by mouth daily. Centrum Silver   oxyCODONE 5 MG immediate release tablet Commonly known as:  Oxy IR/ROXICODONE 1 tablet every 4 hrs as needed for pain.  Patient had a total knee replacement on 09/14/2017   polyethylene glycol packet Commonly known as:  MIRALAX / GLYCOLAX 17grams in 6 oz of water twice a day until bowel movement.  LAXITIVE.  Restart if two days since last bowel movement   Vitamin D3 2000 units Tabs Take 2,000 Units by mouth daily.            Discharge Care Instructions  (From admission, onward)        Start     Ordered   09/16/17 0000  Change dressing    Comments:  DO NOT REMOVE BANDAGE OVER SURGICAL INCISION.  [place] WHOLE LEG INCLUDING OVER THE WATERPROOF BANDAGE WITH SOAP AND WATER EVERY DAY.   09/16/17 1354      Diagnostic Studies: Dg Chest Port 1 View  Result Date: 09/15/2017 CLINICAL DATA:  Hypoxia.  Knee replacement yesterday EXAM: PORTABLE CHEST 1 VIEW COMPARISON:  07/08/2017 FINDINGS: Normal  heart size when accounting for rotation. Stable aortic tortuosity and negative hila. There is no edema, consolidation, effusion, or pneumothorax. Spondylosis. IMPRESSION: No evidence of active disease.  Stable from prior. Electronically Signed   By: Monte Fantasia M.D.   On: 09/15/2017 10:03    Disposition: Discharge disposition: 01-Home or Self Care       Discharge Instructions    CPM   Complete by:  As directed    Continuous passive motion machine (CPM):      Use the CPM from 0 to 90 for 6 hours  per day.       You may break it up into 2 or 3 sessions per day.      Use CPM for 2 weeks or until you are told to stop.   Call MD / Call 911   Complete by:  As directed    If you experience chest pain or shortness of breath, CALL 911 and be transported to the hospital emergency room.  If you develope a fever above 101 F, pus (white drainage) or increased drainage or redness at the wound, or calf pain, call your surgeon's office.   Change dressing   Complete by:  As directed    DO NOT REMOVE BANDAGE OVER SURGICAL INCISION.  [place] WHOLE LEG INCLUDING OVER THE WATERPROOF BANDAGE WITH SOAP AND WATER EVERY DAY.   Constipation Prevention   Complete by:  As directed    Drink plenty of fluids.  Prune juice may be helpful.  You may use a stool softener, such as Colace (over the counter) 100 mg twice a day.  Use MiraLax (over the counter) for constipation as needed.   Diet - low sodium heart healthy   Complete by:  As directed    Discharge instructions   Complete by:  As directed    INSTRUCTIONS AFTER JOINT REPLACEMENT   Remove items at home which could result in a fall. This includes throw rugs or furniture in walking pathways ICE to the affected joint every three hours while awake for 30 minutes at a time, for at least the first 3-5 days, and then as needed for pain and swelling.  Continue to use ice for pain and swelling. You may notice swelling that will progress down to the foot and ankle.   This is normal after surgery.  Elevate your leg when you are not up walking on it.   Continue to use the breathing machine you got in the hospital (incentive spirometer) which will help keep your temperature down.  It is common for your temperature to cycle up and down following surgery, especially at night when you are not up moving around and exerting yourself.  The breathing machine keeps your lungs expanded and your temperature down.   DIET:  As you were doing prior to hospitalization, we recommend a well-balanced diet.  DRESSING / WOUND CARE / SHOWERING  Keep the surgical dressing until follow up.  The dressing is water proof, so you can shower without any extra covering.  IF THE DRESSING FALLS OFF or the wound gets wet inside, change the dressing with sterile gauze.  Please use good hand washing techniques before changing the dressing.  Do not use any lotions or creams on the incision until instructed by your surgeon.    ACTIVITY  Increase activity slowly as tolerated, but follow the weight bearing instructions below.   No driving for 6 weeks or until further direction given by your physician.  You cannot drive while taking narcotics.  No lifting or carrying greater than 10 lbs. until further directed by your surgeon. Avoid periods of inactivity such as sitting longer than an hour when not asleep. This helps prevent blood clots.  You may return to work once you are authorized by your doctor.     WEIGHT BEARING   Weight bearing as tolerated with assist device (walker, cane, etc) as directed, use it as long as suggested by your surgeon or therapist, typically at least 2-3 weeks.   EXERCISES  Results after joint replacement surgery are often greatly  improved when you follow the exercise, range of motion and muscle strengthening exercises prescribed by your doctor. Safety measures are also important to protect the joint from further injury. Any time any of these exercises cause you to  have increased pain or swelling, decrease what you are doing until you are comfortable again and then slowly increase them. If you have problems or questions, call your caregiver or physical therapist for advice.   Rehabilitation is important following a joint replacement. After just a few days of immobilization, the muscles of the leg can become weakened and shrink (atrophy).  These exercises are designed to build up the tone and strength of the thigh and leg muscles and to improve motion. Often times heat used for twenty to thirty minutes before working out will loosen up your tissues and help with improving the range of motion but do not use heat for the first two weeks following surgery (sometimes heat can increase post-operative swelling).   These exercises can be done on a training (exercise) mat, on the floor, on a table or on a bed. Use whatever works the best and is most comfortable for you.    Use music or television while you are exercising so that the exercises are a pleasant break in your day. This will make your life better with the exercises acting as a break in your routine that you can look forward to.   Perform all exercises about fifteen times, three times per day or as directed.  You should exercise both the operative leg and the other leg as well.   Exercises include:  Quad Sets - Tighten up the muscle on the front of the thigh (Quad) and hold for 5-10 seconds.   Straight Leg Raises - With your knee straight (if you were given a brace, keep it on), lift the leg to 60 degrees, hold for 3 seconds, and slowly lower the leg.  Perform this exercise against resistance later as your leg gets stronger.  Leg Slides: Lying on your back, slowly slide your foot toward your buttocks, bending your knee up off the floor (only go as far as is comfortable). Then slowly slide your foot back down until your leg is flat on the floor again.  Angel Wings: Lying on your back spread your legs to the side as  far apart as you can without causing discomfort.  Hamstring Strength:  Lying on your back, push your heel against the floor with your leg straight by tightening up the muscles of your buttocks.  Repeat, but this time bend your knee to a comfortable angle, and push your heel against the floor.  You may put a pillow under the heel to make it more comfortable if necessary.   A rehabilitation program following joint replacement surgery can speed recovery and prevent re-injury in the future due to weakened muscles. Contact your doctor or a physical therapist for more information on knee rehabilitation.    CONSTIPATION  Constipation is defined medically as fewer than three stools per week and severe constipation as less than one stool per week.  Even if you have a regular bowel pattern at home, your normal regimen is likely to be disrupted due to multiple reasons following surgery.  Combination of anesthesia, postoperative narcotics, change in appetite and fluid intake all can affect your bowels.   YOU MUST use at least one of the following options; they are listed in order of increasing strength to get the job done.  They are  all available over the counter, and you may need to use some, POSSIBLY even all of these options:    Drink plenty of fluids (prune juice may be helpful) and high fiber foods Colace 100 mg by mouth twice a day  Senokot for constipation as directed and as needed Dulcolax (bisacodyl), take with full glass of water  Miralax (polyethylene glycol) once or twice a day as needed.  If you have tried all these things and are unable to have a bowel movement in the first 3-4 days after surgery call either your surgeon or your primary doctor.    If you experience loose stools or diarrhea, hold the medications until you stool forms back up.  If your symptoms do not get better within 1 week or if they get worse, check with your doctor.  If you experience "the worst abdominal pain ever" or  develop nausea or vomiting, please contact the office immediately for further recommendations for treatment.   ITCHING:  If you experience itching with your medications, try taking only a single pain pill, or even half a pain pill at a time.  You can also use Benadryl over the counter for itching or also to help with sleep.   TED HOSE STOCKINGS:  Use stockings on both legs until for at least 2 weeks or as directed by physician office. They may be removed at night for sleeping.  MEDICATIONS:  See your medication summary on the "After Visit Summary" that nursing will review with you.  You may have some home medications which will be placed on hold until you complete the course of blood thinner medication.  It is important for you to complete the blood thinner medication as prescribed.  PRECAUTIONS:  If you experience chest pain or shortness of breath - call 911 immediately for transfer to the hospital emergency department.   If you develop a fever greater that 101 F, purulent drainage from wound, increased redness or drainage from wound, foul odor from the wound/dressing, or calf pain - CONTACT YOUR SURGEON.                                                   FOLLOW-UP APPOINTMENTS:  If you do not already have a post-op appointment, please call the office for an appointment to be seen by your surgeon.  Guidelines for how soon to be seen are listed in your "After Visit Summary", but are typically between 1-4 weeks after surgery.  OTHER INSTRUCTIONS:   Knee Replacement:  Do not place pillow under knee, focus on keeping the knee straight while resting. CPM instructions: 0-90 degrees, 2 hours in the morning, 2 hours in the afternoon, and 2 hours in the evening. Place foam block, curve side up under heel at all times except when in CPM or when walking.  DO NOT modify, tear, cut, or change the foam block in any way.  MAKE SURE YOU:  Understand these instructions.  Get help right away if you are not doing  well or get worse.    Thank you for letting us be a part of your medical care team.  It is a privilege we respect greatly.  We hope these instructions will help you stay on track for a fast and full recovery!   Do not put a pillow under the knee. Place it under the  heel.   Complete by:  As directed    Place gray foam block, curve side up under heel at all times except when in CPM or when walking.  DO NOT modify, tear, cut, or change in any way the gray foam block.   Increase activity slowly as tolerated   Complete by:  As directed    Patient may shower   Complete by:  As directed    Aquacel dressing is water proof    Wash over it and the whole leg with soap and water at the end of your shower   TED hose   Complete by:  As directed    Use stockings (TED hose) for 2 weeks on both leg(s).  You may remove them at night for sleeping.      Follow-up Information    [facility] Follow up.   Specialty:  [place] Why:  [place] will call to arrange initial appointment Contact information: [place] [address] [phone]        Elsie Saas, MD Follow up on 09/28/2017.   Specialty:  Orthopedic Surgery Why:  appointment time 2:30 with Dr Leotis Pain information: [place] [id number] [phone]        Special[facility], [facility] Follow up on 09/28/2017.   Specialty:  Orthopedic Surgery Why:  Arrive for Physical therapy at 12:30 for  1 pm appointment with [facility] PT Contact information: [place] [address] [phone]            Signed: Linda Hedges 09/16/2017, 2:19 PM

## 2017-09-16 NOTE — Care Management Note (Signed)
Case Management Note  Patient Details  Name: Jennifer Tran MRN: [id number] Date of Birth: 04-15-1946  Subjective/Objective:    72 yr old female s/p right total knee arthroplasty.                Action/Plan: Case manager spoke with patient and daughter concerning discharge plan and DME. Patient was preoperatively setup with [facility], no changes.    Expected Discharge Date:   09/17/17               Expected Discharge Plan:  [place]  In-House Referral:  NA  Discharge planning Services  CM Consult  Post Acute Care Choice:  Home Health Choice offered to:  Patient  DME Arranged:  3-N-1, Walker rolling, CPM DME Agency:     HH Arranged:  PT HH Agency:  [facility] (formerly [employer])  Status of Service:  Completed, signed off  If discussed at [employer], dates discussed:    Additional Comments:  Ninfa Meeker, RN 09/16/2017, 1:05 PM

## 2017-09-17 DIAGNOSIS — K219 Gastro-esophageal reflux disease without esophagitis: Secondary | ICD-10-CM | POA: Diagnosis not present

## 2017-09-17 DIAGNOSIS — F419 Anxiety disorder, unspecified: Secondary | ICD-10-CM | POA: Diagnosis not present

## 2017-09-17 DIAGNOSIS — F329 Major depressive disorder, single episode, unspecified: Secondary | ICD-10-CM | POA: Diagnosis not present

## 2017-09-17 DIAGNOSIS — Z471 Aftercare following joint replacement surgery: Secondary | ICD-10-CM | POA: Diagnosis not present

## 2017-09-17 DIAGNOSIS — I10 Essential (primary) hypertension: Secondary | ICD-10-CM | POA: Diagnosis not present

## 2017-09-17 DIAGNOSIS — B9561 Methicillin susceptible Staphylococcus aureus infection as the cause of diseases classified elsewhere: Secondary | ICD-10-CM | POA: Diagnosis not present

## 2017-09-18 DIAGNOSIS — K219 Gastro-esophageal reflux disease without esophagitis: Secondary | ICD-10-CM | POA: Diagnosis not present

## 2017-09-18 DIAGNOSIS — B9561 Methicillin susceptible Staphylococcus aureus infection as the cause of diseases classified elsewhere: Secondary | ICD-10-CM | POA: Diagnosis not present

## 2017-09-18 DIAGNOSIS — I10 Essential (primary) hypertension: Secondary | ICD-10-CM | POA: Diagnosis not present

## 2017-09-18 DIAGNOSIS — Z471 Aftercare following joint replacement surgery: Secondary | ICD-10-CM | POA: Diagnosis not present

## 2017-09-18 DIAGNOSIS — F329 Major depressive disorder, single episode, unspecified: Secondary | ICD-10-CM | POA: Diagnosis not present

## 2017-09-18 DIAGNOSIS — F419 Anxiety disorder, unspecified: Secondary | ICD-10-CM | POA: Diagnosis not present

## 2017-09-22 DIAGNOSIS — B9561 Methicillin susceptible Staphylococcus aureus infection as the cause of diseases classified elsewhere: Secondary | ICD-10-CM | POA: Diagnosis not present

## 2017-09-22 DIAGNOSIS — M1711 Unilateral primary osteoarthritis, right knee: Secondary | ICD-10-CM | POA: Diagnosis not present

## 2017-09-22 DIAGNOSIS — I10 Essential (primary) hypertension: Secondary | ICD-10-CM | POA: Diagnosis not present

## 2017-09-22 DIAGNOSIS — F329 Major depressive disorder, single episode, unspecified: Secondary | ICD-10-CM | POA: Diagnosis not present

## 2017-09-22 DIAGNOSIS — F419 Anxiety disorder, unspecified: Secondary | ICD-10-CM | POA: Diagnosis not present

## 2017-09-22 DIAGNOSIS — K219 Gastro-esophageal reflux disease without esophagitis: Secondary | ICD-10-CM | POA: Diagnosis not present

## 2017-09-22 DIAGNOSIS — Z471 Aftercare following joint replacement surgery: Secondary | ICD-10-CM | POA: Diagnosis not present

## 2017-09-24 DIAGNOSIS — I10 Essential (primary) hypertension: Secondary | ICD-10-CM | POA: Diagnosis not present

## 2017-09-24 DIAGNOSIS — Z471 Aftercare following joint replacement surgery: Secondary | ICD-10-CM | POA: Diagnosis not present

## 2017-09-24 DIAGNOSIS — F419 Anxiety disorder, unspecified: Secondary | ICD-10-CM | POA: Diagnosis not present

## 2017-09-24 DIAGNOSIS — B9561 Methicillin susceptible Staphylococcus aureus infection as the cause of diseases classified elsewhere: Secondary | ICD-10-CM | POA: Diagnosis not present

## 2017-09-24 DIAGNOSIS — K219 Gastro-esophageal reflux disease without esophagitis: Secondary | ICD-10-CM | POA: Diagnosis not present

## 2017-09-24 DIAGNOSIS — F329 Major depressive disorder, single episode, unspecified: Secondary | ICD-10-CM | POA: Diagnosis not present

## 2017-09-24 DIAGNOSIS — M1711 Unilateral primary osteoarthritis, right knee: Secondary | ICD-10-CM | POA: Diagnosis not present

## 2017-09-25 DIAGNOSIS — F329 Major depressive disorder, single episode, unspecified: Secondary | ICD-10-CM | POA: Diagnosis not present

## 2017-09-25 DIAGNOSIS — B9561 Methicillin susceptible Staphylococcus aureus infection as the cause of diseases classified elsewhere: Secondary | ICD-10-CM | POA: Diagnosis not present

## 2017-09-25 DIAGNOSIS — Z471 Aftercare following joint replacement surgery: Secondary | ICD-10-CM | POA: Diagnosis not present

## 2017-09-25 DIAGNOSIS — I10 Essential (primary) hypertension: Secondary | ICD-10-CM | POA: Diagnosis not present

## 2017-09-25 DIAGNOSIS — F419 Anxiety disorder, unspecified: Secondary | ICD-10-CM | POA: Diagnosis not present

## 2017-09-25 DIAGNOSIS — K219 Gastro-esophageal reflux disease without esophagitis: Secondary | ICD-10-CM | POA: Diagnosis not present

## 2017-09-28 DIAGNOSIS — M25661 Stiffness of right knee, not elsewhere classified: Secondary | ICD-10-CM | POA: Diagnosis not present

## 2017-09-28 DIAGNOSIS — M6281 Muscle weakness (generalized): Secondary | ICD-10-CM | POA: Diagnosis not present

## 2017-09-28 DIAGNOSIS — R262 Difficulty in walking, not elsewhere classified: Secondary | ICD-10-CM | POA: Diagnosis not present

## 2017-09-28 DIAGNOSIS — M1711 Unilateral primary osteoarthritis, right knee: Secondary | ICD-10-CM | POA: Diagnosis not present

## 2017-09-28 DIAGNOSIS — M25561 Pain in right knee: Secondary | ICD-10-CM | POA: Diagnosis not present

## 2017-09-30 DIAGNOSIS — M25661 Stiffness of right knee, not elsewhere classified: Secondary | ICD-10-CM | POA: Diagnosis not present

## 2017-09-30 DIAGNOSIS — M25561 Pain in right knee: Secondary | ICD-10-CM | POA: Diagnosis not present

## 2017-09-30 DIAGNOSIS — M6281 Muscle weakness (generalized): Secondary | ICD-10-CM | POA: Diagnosis not present

## 2017-09-30 DIAGNOSIS — R262 Difficulty in walking, not elsewhere classified: Secondary | ICD-10-CM | POA: Diagnosis not present

## 2017-10-05 DIAGNOSIS — M1711 Unilateral primary osteoarthritis, right knee: Secondary | ICD-10-CM | POA: Diagnosis not present

## 2017-10-06 DIAGNOSIS — M6281 Muscle weakness (generalized): Secondary | ICD-10-CM | POA: Diagnosis not present

## 2017-10-06 DIAGNOSIS — M25561 Pain in right knee: Secondary | ICD-10-CM | POA: Diagnosis not present

## 2017-10-06 DIAGNOSIS — R262 Difficulty in walking, not elsewhere classified: Secondary | ICD-10-CM | POA: Diagnosis not present

## 2017-10-06 DIAGNOSIS — M25661 Stiffness of right knee, not elsewhere classified: Secondary | ICD-10-CM | POA: Diagnosis not present

## 2017-10-08 DIAGNOSIS — M25661 Stiffness of right knee, not elsewhere classified: Secondary | ICD-10-CM | POA: Diagnosis not present

## 2017-10-08 DIAGNOSIS — R262 Difficulty in walking, not elsewhere classified: Secondary | ICD-10-CM | POA: Diagnosis not present

## 2017-10-08 DIAGNOSIS — M6281 Muscle weakness (generalized): Secondary | ICD-10-CM | POA: Diagnosis not present

## 2017-10-08 DIAGNOSIS — M25561 Pain in right knee: Secondary | ICD-10-CM | POA: Diagnosis not present

## 2017-10-12 DIAGNOSIS — M25661 Stiffness of right knee, not elsewhere classified: Secondary | ICD-10-CM | POA: Diagnosis not present

## 2017-10-12 DIAGNOSIS — M25561 Pain in right knee: Secondary | ICD-10-CM | POA: Diagnosis not present

## 2017-10-12 DIAGNOSIS — M6281 Muscle weakness (generalized): Secondary | ICD-10-CM | POA: Diagnosis not present

## 2017-10-12 DIAGNOSIS — R262 Difficulty in walking, not elsewhere classified: Secondary | ICD-10-CM | POA: Diagnosis not present

## 2017-10-14 DIAGNOSIS — M25561 Pain in right knee: Secondary | ICD-10-CM | POA: Diagnosis not present

## 2017-10-14 DIAGNOSIS — M6281 Muscle weakness (generalized): Secondary | ICD-10-CM | POA: Diagnosis not present

## 2017-10-14 DIAGNOSIS — R262 Difficulty in walking, not elsewhere classified: Secondary | ICD-10-CM | POA: Diagnosis not present

## 2017-10-14 DIAGNOSIS — M25661 Stiffness of right knee, not elsewhere classified: Secondary | ICD-10-CM | POA: Diagnosis not present

## 2017-10-19 DIAGNOSIS — R262 Difficulty in walking, not elsewhere classified: Secondary | ICD-10-CM | POA: Diagnosis not present

## 2017-10-19 DIAGNOSIS — M6281 Muscle weakness (generalized): Secondary | ICD-10-CM | POA: Diagnosis not present

## 2017-10-19 DIAGNOSIS — M25561 Pain in right knee: Secondary | ICD-10-CM | POA: Diagnosis not present

## 2017-10-19 DIAGNOSIS — M25661 Stiffness of right knee, not elsewhere classified: Secondary | ICD-10-CM | POA: Diagnosis not present

## 2017-10-21 DIAGNOSIS — M25661 Stiffness of right knee, not elsewhere classified: Secondary | ICD-10-CM | POA: Diagnosis not present

## 2017-10-21 DIAGNOSIS — M25561 Pain in right knee: Secondary | ICD-10-CM | POA: Diagnosis not present

## 2017-10-21 DIAGNOSIS — R262 Difficulty in walking, not elsewhere classified: Secondary | ICD-10-CM | POA: Diagnosis not present

## 2017-10-21 DIAGNOSIS — M6281 Muscle weakness (generalized): Secondary | ICD-10-CM | POA: Diagnosis not present

## 2017-10-26 DIAGNOSIS — M6281 Muscle weakness (generalized): Secondary | ICD-10-CM | POA: Diagnosis not present

## 2017-10-26 DIAGNOSIS — R262 Difficulty in walking, not elsewhere classified: Secondary | ICD-10-CM | POA: Diagnosis not present

## 2017-10-26 DIAGNOSIS — M25561 Pain in right knee: Secondary | ICD-10-CM | POA: Diagnosis not present

## 2017-10-26 DIAGNOSIS — M25661 Stiffness of right knee, not elsewhere classified: Secondary | ICD-10-CM | POA: Diagnosis not present

## 2017-10-28 DIAGNOSIS — M6281 Muscle weakness (generalized): Secondary | ICD-10-CM | POA: Diagnosis not present

## 2017-10-28 DIAGNOSIS — M25661 Stiffness of right knee, not elsewhere classified: Secondary | ICD-10-CM | POA: Diagnosis not present

## 2017-10-28 DIAGNOSIS — M25561 Pain in right knee: Secondary | ICD-10-CM | POA: Diagnosis not present

## 2017-10-28 DIAGNOSIS — R262 Difficulty in walking, not elsewhere classified: Secondary | ICD-10-CM | POA: Diagnosis not present

## 2017-11-03 DIAGNOSIS — M25561 Pain in right knee: Secondary | ICD-10-CM | POA: Diagnosis not present

## 2017-12-22 NOTE — Pre-Procedure Instructions (Signed)
Jennifer Tran  12/22/2017      [id number] - [place], [place] - [address] AT [address] [place] [address] Phone: [phone] Fax: [phone]    Your procedure is scheduled on Sept. 9  Report to [facility] Admitting at 7:45 A.M.  Call this number if you have problems the morning of surgery:  [phone]   Remember:  Do not eat or drink after midnight.      Take these medicines the morning of surgery with A SIP OF WATER :                Tylenol if needed               Duloxetine (cymbalta)               Famotidine (pepcid)               flonase nasal spray               Metoprolol (toprol-xl)             7 days prior to surgery STOP taking any Aspirin(unless otherwise instructed by your surgeon), Aleve, Naproxen, Ibuprofen, Motrin, Advil, Goody's, BC's, all herbal medications, fish oil, and all vitamins                   Do not wear jewelry, make-up or nail polish.  Do not wear lotions, powders, or perfumes, or deodorant.  Do not shave 48 hours prior to surgery.  Men may shave face and neck.  Do not bring valuables to the hospital.  [facility] is not responsible for any belongings or valuables.  Contacts, dentures or bridgework may not be worn into surgery.  Leave your suitcase in the car.  After surgery it may be brought to your room.  For patients admitted to the hospital, discharge time will be determined by your treatment team.  Patients discharged the day of surgery will not be allowed to drive home.    Special instructions:  [facility]- Preparing For Surgery  Before surgery, you can play an important role. Because skin is not sterile, your skin needs to be as free of germs as possible. You can reduce the number of germs on your skin by washing with CHG (chlorahexidine gluconate) Soap before surgery.  CHG is an antiseptic cleaner which kills germs and bonds with the skin to continue  killing germs even after washing.    Oral Hygiene is also important to reduce your risk of infection.  Remember - BRUSH YOUR TEETH THE MORNING OF SURGERY WITH YOUR REGULAR TOOTHPASTE  Please do not use if you have an allergy to CHG or antibacterial soaps. If your skin becomes reddened/irritated stop using the CHG.  Do not shave (including legs and underarms) for at least 48 hours prior to first CHG shower. It is OK to shave your face.  Please follow these instructions carefully.   1. Shower the NIGHT BEFORE SURGERY and the MORNING OF SURGERY with CHG.   2. If you chose to wash your hair, wash your hair first as usual with your normal shampoo.  3. After you shampoo, rinse your hair and body thoroughly to remove the shampoo.  4. Use CHG as you would any other liquid soap. You can apply CHG directly to the skin and wash gently with a scrungie or a clean washcloth.  5. Apply the CHG Soap to your body ONLY FROM THE NECK DOWN.  Do not use on open wounds or open sores. Avoid contact with your eyes, ears, mouth and genitals (private parts). Wash Face and genitals (private parts)  with your normal soap.  6. Wash thoroughly, paying special attention to the area where your surgery will be performed.  7. Thoroughly rinse your body with warm water from the neck down.  8. DO NOT shower/wash with your normal soap after using and rinsing off the CHG Soap.  9. Pat yourself dry with a CLEAN TOWEL.  10. Wear CLEAN PAJAMAS to bed the night before surgery, wear comfortable clothes the morning of surgery  11. Place CLEAN SHEETS on your bed the night of your first shower and DO NOT SLEEP WITH PETS.    Day of Surgery:  Do not apply any deodorants/lotions.  Please wear clean clothes to the hospital/surgery center.   Remember to brush your teeth WITH YOUR REGULAR TOOTHPASTE.    Please read over the following fact sheets that you were given. Coughing and Deep Breathing, MRSA Information and Surgical  Site Infection Prevention

## 2017-12-23 ENCOUNTER — Encounter (HOSPITAL_COMMUNITY)
Admission: RE | Admit: 2017-12-23 | Discharge: 2017-12-23 | Disposition: A | Discharge status: Home / Self Care | Payer: Medicare Other | Source: Ambulatory Visit | Attending: Orthopedic Surgery | Admitting: Orthopedic Surgery

## 2017-12-23 ENCOUNTER — Other Ambulatory Visit: Payer: Self-pay

## 2017-12-23 ENCOUNTER — Encounter (HOSPITAL_COMMUNITY): Payer: Self-pay

## 2017-12-23 DIAGNOSIS — Z886 Allergy status to analgesic agent status: Secondary | ICD-10-CM | POA: Diagnosis not present

## 2017-12-23 DIAGNOSIS — M1712 Unilateral primary osteoarthritis, left knee: Secondary | ICD-10-CM | POA: Diagnosis not present

## 2017-12-23 DIAGNOSIS — Z9049 Acquired absence of other specified parts of digestive tract: Secondary | ICD-10-CM | POA: Insufficient documentation

## 2017-12-23 DIAGNOSIS — I10 Essential (primary) hypertension: Secondary | ICD-10-CM | POA: Insufficient documentation

## 2017-12-23 DIAGNOSIS — Z7951 Long term (current) use of inhaled steroids: Secondary | ICD-10-CM | POA: Insufficient documentation

## 2017-12-23 DIAGNOSIS — Z0183 Encounter for blood typing: Secondary | ICD-10-CM | POA: Diagnosis not present

## 2017-12-23 DIAGNOSIS — Z01812 Encounter for preprocedural laboratory examination: Secondary | ICD-10-CM | POA: Diagnosis not present

## 2017-12-23 DIAGNOSIS — Z22322 Carrier or suspected carrier of Methicillin resistant Staphylococcus aureus: Secondary | ICD-10-CM | POA: Insufficient documentation

## 2017-12-23 DIAGNOSIS — Z85820 Personal history of malignant melanoma of skin: Secondary | ICD-10-CM | POA: Insufficient documentation

## 2017-12-23 DIAGNOSIS — Z79899 Other long term (current) drug therapy: Secondary | ICD-10-CM | POA: Diagnosis not present

## 2017-12-23 DIAGNOSIS — M25562 Pain in left knee: Secondary | ICD-10-CM | POA: Diagnosis not present

## 2017-12-23 DIAGNOSIS — F419 Anxiety disorder, unspecified: Secondary | ICD-10-CM | POA: Insufficient documentation

## 2017-12-23 DIAGNOSIS — K219 Gastro-esophageal reflux disease without esophagitis: Secondary | ICD-10-CM | POA: Diagnosis not present

## 2017-12-23 DIAGNOSIS — Z87891 Personal history of nicotine dependence: Secondary | ICD-10-CM | POA: Diagnosis not present

## 2017-12-23 DIAGNOSIS — Z91041 Radiographic dye allergy status: Secondary | ICD-10-CM | POA: Insufficient documentation

## 2017-12-23 DIAGNOSIS — Z96651 Presence of right artificial knee joint: Secondary | ICD-10-CM | POA: Insufficient documentation

## 2017-12-23 DIAGNOSIS — Z880 Allergy status to penicillin: Secondary | ICD-10-CM | POA: Insufficient documentation

## 2017-12-23 HISTORY — DX: Other complications of anesthesia, initial encounter: T88.59XA

## 2017-12-23 HISTORY — DX: Adverse effect of unspecified anesthetic, initial encounter: T41.45XA

## 2017-12-23 LAB — COMPREHENSIVE METABOLIC PANEL
ALBUMIN: 3.7 g/dL (ref 3.5–5.0)
ALT: 24 U/L (ref 0–44)
AST: 23 U/L (ref 15–41)
Alkaline Phosphatase: 106 U/L (ref 38–126)
Anion gap: 7 (ref 5–15)
BUN: 18 mg/dL (ref 8–23)
CHLORIDE: 105 mmol/L (ref 98–111)
CO2: 30 mmol/L (ref 22–32)
Calcium: 9.6 mg/dL (ref 8.9–10.3)
Creatinine, Ser: 1.04 mg/dL — ABNORMAL HIGH (ref 0.44–1.00)
GFR calc Af Amer: >60 mL/min (ref >60)
GFR, EST NON AFRICAN AMERICAN: 53 mL/min — AB (ref >60)
GLUCOSE: 110 mg/dL — AB (ref 70–99)
POTASSIUM: 3.7 mmol/L (ref 3.5–5.1)
SODIUM: 142 mmol/L (ref 135–145)
Total Bilirubin: 0.8 mg/dL (ref 0.3–1.2)
Total Protein: 6.4 g/dL — ABNORMAL LOW (ref 6.5–8.1)

## 2017-12-23 LAB — PROTIME-INR
INR: 1.01
Prothrombin Time: 13.2 seconds (ref 11.4–15.2)

## 2017-12-23 LAB — CBC WITH DIFFERENTIAL/PLATELET
ABS IMMATURE GRANULOCYTES: 0 10*3/uL (ref 0.0–0.1)
BASOS ABS: 0.1 10*3/uL (ref 0.0–0.1)
BASOS PCT: 1 %
EOS ABS: 0.4 10*3/uL (ref 0.0–0.7)
Eosinophils Relative: 5 %
HCT: 43.6 % (ref 36.0–46.0)
Hemoglobin: 13.6 g/dL (ref 12.0–15.0)
Immature Granulocytes: 0 %
Lymphocytes Relative: 27 %
Lymphs Abs: 2.1 10*3/uL (ref 0.7–4.0)
MCH: 28.6 pg (ref 26.0–34.0)
MCHC: 31.2 g/dL (ref 30.0–36.0)
MCV: 91.8 fL (ref 78.0–100.0)
Monocytes Absolute: 0.4 10*3/uL (ref 0.1–1.0)
Monocytes Relative: 6 %
NEUTROS ABS: 4.7 10*3/uL (ref 1.7–7.7)
NEUTROS PCT: 61 %
PLATELETS: 298 10*3/uL (ref 150–400)
RBC: 4.75 MIL/uL (ref 3.87–5.11)
RDW: 12.4 % (ref 11.5–15.5)
WBC: 7.7 10*3/uL (ref 4.0–10.5)

## 2017-12-23 LAB — SURGICAL PCR SCREEN
MRSA, PCR: NEGATIVE
STAPHYLOCOCCUS AUREUS: NEGATIVE

## 2017-12-23 LAB — TYPE AND SCREEN
ABO/RH(D): O POS
ANTIBODY SCREEN: NEGATIVE

## 2017-12-23 LAB — ABO/RH: ABO/RH(D): O POS

## 2017-12-23 LAB — APTT: aPTT: 30 seconds (ref 24–36)

## 2017-12-23 NOTE — H&P (Signed)
TOTAL KNEE ADMISSION H&P  Patient is being admitted for left total knee arthroplasty.  Subjective:  Chief Complaint:left knee pain.  HPI: Jennifer Tran, 72 y.o. female, has a history of pain and functional disability in the left knee due to arthritis and has failed non-surgical conservative treatments for greater than 12 weeks to includeNSAID's and/or analgesics, corticosteriod injections, viscosupplementation injections, flexibility and strengthening excercises, supervised PT with diminished ADL's post treatment, use of assistive devices, weight reduction as appropriate and activity modification.  Onset of symptoms was gradual, starting 10 years ago with gradually worsening course since that time. The patient noted no past surgery on the left knee(s).  Patient currently rates pain in the left knee(s) at 10 out of 10 with activity. Patient has night pain, worsening of pain with activity and weight bearing, pain that interferes with activities of daily living, crepitus and joint swelling.  Patient has evidence of subchondral sclerosis, periarticular osteophytes and joint space narrowing by imaging studies.There is no active infection.  Patient Active Problem List   Diagnosis Date Noted  . Leukocytosis 09/15/2017  . 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 Primary localized osteoarthritis of right knee   . Anxiety   . GERD (gastroesophageal reflux disease)    Past Medical History:  Diagnosis Date  . Anxiety   . Arthritis   . Cancer ([place])    right knee melanoma  1992  . Complication of anesthesia    oxygen sats dropped on floor, nurses stated to take deeper breaths  . GERD (gastroesophageal reflux disease)   . Hypertension   . Leukocytosis 09/15/2017  . 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 Primary localized osteoarthritis of right knee   . Reflux     Past Surgical History:  Procedure Laterality Date  . BREAST SURGERY     right  lumpectomy early 2000  . CHOLECYSTECTOMY    . TOTAL KNEE ARTHROPLASTY Right 09/14/2017   Procedure: TOTAL KNEE ARTHROPLASTY;  Surgeon: Elsie Saas, MD;  Location: [facility];  Service: Orthopedics;  Laterality: Right;    No current facility-administered medications for this encounter.    Current Outpatient Medications  Medication Sig Dispense Refill Last Dose  . acetaminophen (TYLENOL) 325 MG tablet Take 325 mg by mouth every 6 (six) hours as needed (for pain/headaches.).   09/14/2017 at 0400  . ALPRAZolam (XANAX) 0.25 MG tablet Take 0.25 mg by mouth at bedtime as needed for sleep.    09/14/2017 at 0400  . Cholecalciferol (VITAMIN D3) 2000 units TABS Take 2,000 Units by mouth daily.   09/13/2017 at Unknown time  . Cyanocobalamin (VITAMIN B-12) 500 MCG SUBL Place 500 mcg under the tongue daily.     . DULoxetine (CYMBALTA) 30 MG capsule Take 30 mg by mouth every morning.   3 09/14/2017 at 0400  . famotidine (PEPCID) 10 MG tablet Take 10 mg by mouth every morning.    09/14/2017 at 0400  . fluticasone (FLONASE) 50 MCG/ACT nasal spray Place 1 spray into both nostrils every morning.    09/14/2017 at 0400  . metoprolol succinate (TOPROL-XL) 50 MG 24 hr tablet Take 50 mg by mouth every morning. Take with or immediately following a meal.    09/14/2017 at 0400  . Multiple Vitamin (MULTIVITAMIN WITH MINERALS) TABS tablet Take 1 tablet by mouth daily.    Past Week at Unknown time  . mupirocin nasal ointment (BACTROBAN) 2 % Place 1 application into the nose 2 (two) times daily. For the first 5  days of every month     . aspirin EC 325 MG EC tablet 1 tab a day for the next 30 days to prevent blood clots (Patient not taking: Reported on 12/21/2017) 30 tablet 0 Not Taking at Unknown time  . docusate sodium (COLACE) 100 MG capsule 1 tab 2 times a day while on narcotics.  STOOL SOFTENER (Patient not taking: Reported on 12/21/2017) 60 capsule 0 Not Taking at Unknown time  . oxyCODONE (OXY IR/ROXICODONE) 5 MG immediate release  tablet 1 tablet every 4 hrs as needed for pain.  Patient had a total knee replacement on 09/14/2017 (Patient not taking: Reported on 12/21/2017) 40 tablet 0 Not Taking at Unknown time  . polyethylene glycol (MIRALAX / GLYCOLAX) packet 17grams in 6 oz of water twice a day until bowel movement.  LAXITIVE.  Restart if two days since last bowel movement (Patient not taking: Reported on 12/21/2017) 14 each 0 Not Taking at Unknown time   Allergies  Allergen Reactions  . Ivp Dye [Iodinated Diagnostic Agents] Anaphylaxis  . Nsaids Swelling and Other (See Comments)    Ankle swelling   . Penicillins Rash and Other (See Comments)    This patient has had Ancef IV with no adverse reaction.  Will use again perioperatively     PATIENT HAS HAD A PCN REACTION WITH IMMEDIATE RASH, FACIAL/TONGUE/THROAT SWELLING, SOB, OR LIGHTHEADEDNESS WITH HYPOTENSION:  #  #  YES  #  #  Has patient had a PCN reaction causing severe rash involving mucus membranes or skin necrosis: Unknown Has patient had a PCN reaction that required hospitalization: No Has patient had a PCN reaction occurring within the last 10 years: No   . Steri-Strip Compound Benzoin [Benzoin Compound] Other (See Comments)    Blisters and Skin burning    Social History   Tobacco Use  . Smoking status: Former Smoker    Last attempt to quit: 09/03/1973    Years since quitting: 44.3  . Smokeless tobacco: Never Used  Substance Use Topics  . Alcohol use: No    No family history on file.   Review of Systems  Constitutional: Negative.   HENT: Negative.   Eyes: Negative.   Respiratory: Negative.   Cardiovascular: Negative.   Gastrointestinal: Negative.   Genitourinary: Negative.   Musculoskeletal: Positive for back pain and joint pain.  Skin: Negative.   Neurological: Negative.   Endo/Heme/Allergies: Negative.   Psychiatric/Behavioral: Negative.     Objective:  Physical Exam  Constitutional: She is oriented to person, place, and time. She  appears well-developed and well-nourished.  HENT:  Head: Normocephalic and atraumatic.  Eyes: Pupils are equal, round, and reactive to light. Conjunctivae and EOM are normal.  Neck: Neck supple.  Cardiovascular: Normal rate and regular rhythm.  Respiratory: Effort normal and breath sounds normal.  GI: Soft. Bowel sounds are normal.  Musculoskeletal:  Examination of the left knee reveals diffuse pain.  Mild varus deformity.  1+ synovitis.  Range of motion -5 to 120 degrees.  Knee is stable with normal patella tracking.  Right knee has full range of motion with a well healed total knee incision no pain swelling or deformity.   Vascular exam: Pulses are 2+ and symmetric.  Neurologic exam: Distal motor and sensory examination is within normal limits.    Neurological: She is alert and oriented to person, place, and time.  Skin: Skin is warm and dry.  Psychiatric: She has a normal mood and affect. Her behavior is normal.  Vital signs in last 24 hours: Temp:  [98.3 F (36.8 C)] 98.3 F (36.8 C) (08/28 1057) Pulse Rate:  [86] 86 (08/28 1057) Resp:  [20] 20 (08/28 1057) BP: (127)/(78) 127/78 (08/28 1057) SpO2:  [95 %] 95 % (08/28 1057) Weight:  [85 kg] 85 kg (08/28 1057)  Labs:   Estimated body mass index is 32.17 kg/m as calculated from the following:   Height as of 12/23/17: 5\' 4"  (1.626 m).   Weight as of 12/23/17: 85 kg.   Imaging Review Plain radiographs demonstrate severe degenerative joint disease of the left knee(s). The overall alignment issignificant varus. The bone quality appears to be good for age and reported activity level.   Preoperative templating of the joint replacement has been completed, documented, and submitted to the Operating Room personnel in order to optimize intra-operative equipment management.   Anticipated LOS equal to or greater than 2 midnights due to - Age 54 and older with one or more of the following:  - Obesity  - Expected need for hospital  services (PT, OT, Nursing) required for safe  discharge  - Anticipated need for postoperative skilled nursing care or inpatient rehab  - Active co-morbidities: None OR   - Unanticipated findings during/Post Surgery: None  - Patient is a high risk of re-admission due to: None     Assessment/Plan:  End stage arthritis, left knee   The patient history, physical examination, clinical judgment of the provider and imaging studies are consistent with end stage degenerative joint disease of the left knee(s) and total knee arthroplasty is deemed medically necessary. The treatment options including medical management, injection therapy arthroscopy and arthroplasty were discussed at length. The risks and benefits of total knee arthroplasty were presented and reviewed. The risks due to aseptic loosening, infection, stiffness, patella tracking problems, thromboembolic complications and other imponderables were discussed. The patient acknowledged the explanation, agreed to proceed with the plan and consent was signed. Patient is being admitted for inpatient treatment for surgery, pain control, PT, OT, prophylactic antibiotics, VTE prophylaxis, progressive ambulation and ADL's and discharge planning. The patient is planning to be discharged home with home health services daughter will take care of her post op   Her daughter's name Caryl Pina

## 2017-12-23 NOTE — Progress Notes (Signed)
PCP: Dr. Elenora Fender in [place], [place]  Cardiologist: none

## 2017-12-24 LAB — URINE CULTURE

## 2017-12-31 ENCOUNTER — Other Ambulatory Visit: Payer: Self-pay | Admitting: Orthopedic Surgery

## 2017-12-31 NOTE — Care Plan (Signed)
Patient known to me from prior surgery. Met with her in the office. Will discharge to home with family and the following arrangements;   HHPT: [facility] at [facility] visits  DME- Has all needed - CPM to be delivered by [employer]  OPPT: 01/18/18 @ [facility] location  MD Follow up:  Dr Noemi Chapel - 01/18/18 @ 430   Please contact [place], [place] [phone] with questions or if this plan needs to change    Thanks

## 2018-01-01 MED ORDER — TRANEXAMIC ACID 1000 MG/10ML IV SOLN
1000.0000 mg | INTRAVENOUS | Status: AC
  Administered 2018-01-04: 1000 mg via INTRAVENOUS
  Filled 2018-01-01: qty 1000

## 2018-01-04 ENCOUNTER — Encounter (HOSPITAL_COMMUNITY): Payer: Self-pay | Admitting: Certified Registered Nurse Anesthetist

## 2018-01-04 ENCOUNTER — Inpatient Hospital Stay (HOSPITAL_COMMUNITY): Payer: Medicare Other | Admitting: Certified Registered Nurse Anesthetist

## 2018-01-04 ENCOUNTER — Encounter (HOSPITAL_COMMUNITY)
Admission: RE | Disposition: A | Discharge status: Home Health | Payer: Self-pay | Source: Ambulatory Visit | Attending: Orthopedic Surgery

## 2018-01-04 ENCOUNTER — Inpatient Hospital Stay (HOSPITAL_COMMUNITY)
Admission: RE | Admit: 2018-01-04 | Discharge: 2018-01-05 | DRG: 470 | Disposition: A | Discharge status: Home Health | Payer: Medicare Other | Source: Ambulatory Visit | Attending: Orthopedic Surgery | Admitting: Orthopedic Surgery

## 2018-01-04 ENCOUNTER — Other Ambulatory Visit: Payer: Self-pay

## 2018-01-04 DIAGNOSIS — Z96651 Presence of right artificial knee joint: Secondary | ICD-10-CM | POA: Diagnosis not present

## 2018-01-04 DIAGNOSIS — G8918 Other acute postprocedural pain: Secondary | ICD-10-CM | POA: Diagnosis not present

## 2018-01-04 DIAGNOSIS — E669 Obesity, unspecified: Secondary | ICD-10-CM | POA: Diagnosis present

## 2018-01-04 DIAGNOSIS — Z886 Allergy status to analgesic agent status: Secondary | ICD-10-CM | POA: Diagnosis not present

## 2018-01-04 DIAGNOSIS — K219 Gastro-esophageal reflux disease without esophagitis: Secondary | ICD-10-CM | POA: Diagnosis present

## 2018-01-04 DIAGNOSIS — Z9049 Acquired absence of other specified parts of digestive tract: Secondary | ICD-10-CM

## 2018-01-04 DIAGNOSIS — Z889 Allergy status to unspecified drugs, medicaments and biological substances status: Secondary | ICD-10-CM

## 2018-01-04 DIAGNOSIS — Z79899 Other long term (current) drug therapy: Secondary | ICD-10-CM | POA: Diagnosis not present

## 2018-01-04 DIAGNOSIS — Z7951 Long term (current) use of inhaled steroids: Secondary | ICD-10-CM

## 2018-01-04 DIAGNOSIS — Z91041 Radiographic dye allergy status: Secondary | ICD-10-CM | POA: Diagnosis not present

## 2018-01-04 DIAGNOSIS — Z85820 Personal history of malignant melanoma of skin: Secondary | ICD-10-CM

## 2018-01-04 DIAGNOSIS — M1712 Unilateral primary osteoarthritis, left knee: Principal | ICD-10-CM | POA: Diagnosis present

## 2018-01-04 DIAGNOSIS — Z880 Allergy status to penicillin: Secondary | ICD-10-CM | POA: Diagnosis not present

## 2018-01-04 DIAGNOSIS — M25762 Osteophyte, left knee: Secondary | ICD-10-CM | POA: Diagnosis present

## 2018-01-04 DIAGNOSIS — Z6832 Body mass index (BMI) 32.0-32.9, adult: Secondary | ICD-10-CM

## 2018-01-04 DIAGNOSIS — I10 Essential (primary) hypertension: Secondary | ICD-10-CM | POA: Diagnosis not present

## 2018-01-04 DIAGNOSIS — Z7982 Long term (current) use of aspirin: Secondary | ICD-10-CM

## 2018-01-04 DIAGNOSIS — Z22321 Carrier or suspected carrier of Methicillin susceptible Staphylococcus aureus: Secondary | ICD-10-CM

## 2018-01-04 DIAGNOSIS — Z888 Allergy status to other drugs, medicaments and biological substances status: Secondary | ICD-10-CM | POA: Diagnosis not present

## 2018-01-04 DIAGNOSIS — F419 Anxiety disorder, unspecified: Secondary | ICD-10-CM | POA: Diagnosis not present

## 2018-01-04 DIAGNOSIS — F329 Major depressive disorder, single episode, unspecified: Secondary | ICD-10-CM | POA: Diagnosis not present

## 2018-01-04 DIAGNOSIS — Z87891 Personal history of nicotine dependence: Secondary | ICD-10-CM | POA: Diagnosis not present

## 2018-01-04 HISTORY — PX: TOTAL KNEE ARTHROPLASTY: SHX125

## 2018-01-04 SURGERY — ARTHROPLASTY, KNEE, TOTAL
Anesthesia: Spinal | Site: Knee | Laterality: Left

## 2018-01-04 MED ORDER — SODIUM CHLORIDE 0.9 % IV BOLUS
500.0000 mL | Freq: Once | INTRAVENOUS | Status: AC
  Administered 2018-01-04: 500 mL via INTRAVENOUS

## 2018-01-04 MED ORDER — OXYCODONE HCL 5 MG PO TABS
5.0000 mg | ORAL_TABLET | ORAL | Status: DC | PRN
  Administered 2018-01-05: 10 mg via ORAL
  Filled 2018-01-04 (×2): qty 2

## 2018-01-04 MED ORDER — SODIUM CHLORIDE 0.9 % IV SOLN
INTRAVENOUS | Status: DC | PRN
  Administered 2018-01-04: 30 ug/min via INTRAVENOUS

## 2018-01-04 MED ORDER — PHENYLEPHRINE 40 MCG/ML (10ML) SYRINGE FOR IV PUSH (FOR BLOOD PRESSURE SUPPORT)
PREFILLED_SYRINGE | INTRAVENOUS | Status: AC
  Filled 2018-01-04: qty 10

## 2018-01-04 MED ORDER — ADULT MULTIVITAMIN W/MINERALS CH
1.0000 | ORAL_TABLET | Freq: Every day | ORAL | Status: DC
  Administered 2018-01-04 – 2018-01-05 (×2): 1 via ORAL
  Filled 2018-01-04 (×2): qty 1

## 2018-01-04 MED ORDER — BUPIVACAINE-EPINEPHRINE 0.5% -1:200000 IJ SOLN
INTRAMUSCULAR | Status: AC
  Filled 2018-01-04: qty 1

## 2018-01-04 MED ORDER — MENTHOL 3 MG MT LOZG: 1.0000 | LOZENGE | OROMUCOSAL | Status: DC | PRN

## 2018-01-04 MED ORDER — ALUM & MAG HYDROXIDE-SIMETH 200-200-20 MG/5ML PO SUSP: 30.0000 mL | ORAL | Status: DC | PRN

## 2018-01-04 MED ORDER — CEFAZOLIN SODIUM-DEXTROSE 2-4 GM/100ML-% IV SOLN
2.0000 g | INTRAVENOUS | Status: AC
  Administered 2018-01-04: 2 g via INTRAVENOUS

## 2018-01-04 MED ORDER — MEPERIDINE HCL 50 MG/ML IJ SOLN: 6.2500 mg | INTRAMUSCULAR | Status: DC | PRN

## 2018-01-04 MED ORDER — MUPIROCIN CALCIUM 2 % NA OINT: 1.0000 "application " | TOPICAL_OINTMENT | Freq: Two times a day (BID) | NASAL | Status: DC

## 2018-01-04 MED ORDER — LACTATED RINGERS IV SOLN
INTRAVENOUS | Status: DC
  Administered 2018-01-04 (×2): via INTRAVENOUS

## 2018-01-04 MED ORDER — METOCLOPRAMIDE HCL 5 MG/ML IJ SOLN: 10.0000 mg | Freq: Once | INTRAMUSCULAR | Status: DC | PRN

## 2018-01-04 MED ORDER — ROPIVACAINE HCL 7.5 MG/ML IJ SOLN
INTRAMUSCULAR | Status: DC | PRN
  Administered 2018-01-04: 20 mL via PERINEURAL

## 2018-01-04 MED ORDER — CHLORHEXIDINE GLUCONATE 4 % EX LIQD: 60.0000 mL | Freq: Once | CUTANEOUS | Status: DC

## 2018-01-04 MED ORDER — POLYETHYLENE GLYCOL 3350 17 G PO PACK
17.0000 g | PACK | Freq: Two times a day (BID) | ORAL | Status: DC
  Filled 2018-01-04: qty 1

## 2018-01-04 MED ORDER — MIDAZOLAM HCL 2 MG/2ML IJ SOLN
INTRAMUSCULAR | Status: AC
  Filled 2018-01-04: qty 2

## 2018-01-04 MED ORDER — DEXAMETHASONE SODIUM PHOSPHATE 10 MG/ML IJ SOLN
INTRAMUSCULAR | Status: DC | PRN
  Administered 2018-01-04: 10 mg via INTRAVENOUS

## 2018-01-04 MED ORDER — POTASSIUM CHLORIDE IN NACL 20-0.9 MEQ/L-% IV SOLN
INTRAVENOUS | Status: DC
  Administered 2018-01-04: 17:00:00 via INTRAVENOUS
  Filled 2018-01-04: qty 1000

## 2018-01-04 MED ORDER — FENTANYL CITRATE (PF) 250 MCG/5ML IJ SOLN
INTRAMUSCULAR | Status: AC
  Filled 2018-01-04: qty 5

## 2018-01-04 MED ORDER — POVIDONE-IODINE 7.5 % EX SOLN
Freq: Once | CUTANEOUS | Status: DC
  Filled 2018-01-04: qty 118

## 2018-01-04 MED ORDER — BUPIVACAINE-EPINEPHRINE 0.5% -1:200000 IJ SOLN
INTRAMUSCULAR | Status: DC | PRN
  Administered 2018-01-04: 50 mL

## 2018-01-04 MED ORDER — MIDAZOLAM HCL 5 MG/5ML IJ SOLN
INTRAMUSCULAR | Status: DC | PRN
  Administered 2018-01-04: 1 mg via INTRAVENOUS

## 2018-01-04 MED ORDER — MIDAZOLAM HCL 2 MG/2ML IJ SOLN
INTRAMUSCULAR | Status: AC
  Administered 2018-01-04: 1 mg via INTRAVENOUS
  Filled 2018-01-04: qty 2

## 2018-01-04 MED ORDER — HYDROMORPHONE HCL 1 MG/ML IJ SOLN: 0.2500 mg | INTRAMUSCULAR | Status: DC | PRN

## 2018-01-04 MED ORDER — DIPHENHYDRAMINE HCL 12.5 MG/5ML PO ELIX: 12.5000 mg | ORAL_SOLUTION | ORAL | Status: DC | PRN

## 2018-01-04 MED ORDER — HYDROMORPHONE HCL 1 MG/ML IJ SOLN
0.5000 mg | INTRAMUSCULAR | Status: DC | PRN
  Administered 2018-01-04: 1 mg via INTRAVENOUS
  Filled 2018-01-04: qty 1

## 2018-01-04 MED ORDER — FENTANYL CITRATE (PF) 100 MCG/2ML IJ SOLN
INTRAMUSCULAR | Status: DC | PRN
  Administered 2018-01-04: 25 ug via INTRAVENOUS

## 2018-01-04 MED ORDER — ACETAMINOPHEN 500 MG PO TABS
1000.0000 mg | ORAL_TABLET | Freq: Four times a day (QID) | ORAL | Status: AC
  Administered 2018-01-04 – 2018-01-05 (×4): 1000 mg via ORAL
  Filled 2018-01-04 (×6): qty 2

## 2018-01-04 MED ORDER — 0.9 % SODIUM CHLORIDE (POUR BTL) OPTIME
TOPICAL | Status: DC | PRN
  Administered 2018-01-04: 1000 mL

## 2018-01-04 MED ORDER — ONDANSETRON HCL 4 MG/2ML IJ SOLN
INTRAMUSCULAR | Status: DC | PRN
  Administered 2018-01-04: 4 mg via INTRAVENOUS

## 2018-01-04 MED ORDER — ONDANSETRON HCL 4 MG PO TABS: 4.0000 mg | ORAL_TABLET | Freq: Four times a day (QID) | ORAL | Status: DC | PRN

## 2018-01-04 MED ORDER — PHENYLEPHRINE HCL 10 MG/ML IJ SOLN
INTRAMUSCULAR | Status: DC | PRN
  Administered 2018-01-04 (×5): 80 ug via INTRAVENOUS

## 2018-01-04 MED ORDER — ALPRAZOLAM 0.25 MG PO TABS: 0.2500 mg | ORAL_TABLET | Freq: Every evening | ORAL | Status: DC | PRN

## 2018-01-04 MED ORDER — FENTANYL CITRATE (PF) 100 MCG/2ML IJ SOLN
50.0000 ug | Freq: Once | INTRAMUSCULAR | Status: AC
  Administered 2018-01-04: 50 ug via INTRAVENOUS

## 2018-01-04 MED ORDER — BUPIVACAINE LIPOSOME 1.3 % IJ SUSP
20.0000 mL | Freq: Once | INTRAMUSCULAR | Status: AC
  Administered 2018-01-04: 20 mL
  Filled 2018-01-04: qty 20

## 2018-01-04 MED ORDER — DOCUSATE SODIUM 100 MG PO CAPS
100.0000 mg | ORAL_CAPSULE | Freq: Two times a day (BID) | ORAL | Status: DC
  Administered 2018-01-04 – 2018-01-05 (×3): 100 mg via ORAL
  Filled 2018-01-04 (×3): qty 1

## 2018-01-04 MED ORDER — ONDANSETRON HCL 4 MG/2ML IJ SOLN: 4.0000 mg | Freq: Four times a day (QID) | INTRAMUSCULAR | Status: DC | PRN

## 2018-01-04 MED ORDER — PROPOFOL 500 MG/50ML IV EMUL
INTRAVENOUS | Status: DC | PRN
  Administered 2018-01-04: 80 ug/kg/min via INTRAVENOUS

## 2018-01-04 MED ORDER — SODIUM CHLORIDE 0.9 % IR SOLN
Status: DC | PRN
  Administered 2018-01-04: 3000 mL

## 2018-01-04 MED ORDER — METOCLOPRAMIDE HCL 5 MG/ML IJ SOLN: 5.0000 mg | Freq: Three times a day (TID) | INTRAMUSCULAR | Status: DC | PRN

## 2018-01-04 MED ORDER — PROPOFOL 10 MG/ML IV BOLUS
INTRAVENOUS | Status: DC | PRN
  Administered 2018-01-04: 15 mg via INTRAVENOUS

## 2018-01-04 MED ORDER — VITAMIN D 1000 UNITS PO TABS
2000.0000 [IU] | ORAL_TABLET | Freq: Every day | ORAL | Status: DC
  Administered 2018-01-04 – 2018-01-05 (×2): 2000 [IU] via ORAL
  Filled 2018-01-04 (×2): qty 2

## 2018-01-04 MED ORDER — EPHEDRINE 5 MG/ML INJ
INTRAVENOUS | Status: AC
  Filled 2018-01-04: qty 10

## 2018-01-04 MED ORDER — CEFAZOLIN SODIUM-DEXTROSE 2-4 GM/100ML-% IV SOLN
2.0000 g | Freq: Four times a day (QID) | INTRAVENOUS | Status: AC
  Administered 2018-01-04 (×2): 2 g via INTRAVENOUS
  Filled 2018-01-04 (×2): qty 100

## 2018-01-04 MED ORDER — MIDAZOLAM HCL 2 MG/2ML IJ SOLN
1.0000 mg | Freq: Once | INTRAMUSCULAR | Status: AC
  Administered 2018-01-04: 1 mg via INTRAVENOUS

## 2018-01-04 MED ORDER — GABAPENTIN 300 MG PO CAPS
300.0000 mg | ORAL_CAPSULE | Freq: Every day | ORAL | Status: DC
  Administered 2018-01-04: 300 mg via ORAL
  Filled 2018-01-04: qty 1

## 2018-01-04 MED ORDER — ONDANSETRON HCL 4 MG/2ML IJ SOLN
INTRAMUSCULAR | Status: AC
  Filled 2018-01-04: qty 4

## 2018-01-04 MED ORDER — METOCLOPRAMIDE HCL 5 MG PO TABS: 5.0000 mg | ORAL_TABLET | Freq: Three times a day (TID) | ORAL | Status: DC | PRN

## 2018-01-04 MED ORDER — FENTANYL CITRATE (PF) 100 MCG/2ML IJ SOLN
INTRAMUSCULAR | Status: AC
  Administered 2018-01-04: 50 ug via INTRAVENOUS
  Filled 2018-01-04: qty 2

## 2018-01-04 MED ORDER — PHENOL 1.4 % MT LIQD: 1.0000 | OROMUCOSAL | Status: DC | PRN

## 2018-01-04 MED ORDER — DEXAMETHASONE SODIUM PHOSPHATE 10 MG/ML IJ SOLN
10.0000 mg | Freq: Three times a day (TID) | INTRAMUSCULAR | Status: DC
  Administered 2018-01-04 – 2018-01-05 (×3): 10 mg via INTRAVENOUS
  Filled 2018-01-04 (×3): qty 1

## 2018-01-04 MED ORDER — ASPIRIN EC 325 MG PO TBEC
325.0000 mg | DELAYED_RELEASE_TABLET | Freq: Every day | ORAL | Status: DC
  Administered 2018-01-05: 325 mg via ORAL
  Filled 2018-01-04: qty 1

## 2018-01-04 MED ORDER — SODIUM CHLORIDE 0.9 % IJ SOLN
INTRAMUSCULAR | Status: DC | PRN
  Administered 2018-01-04: 50 mL

## 2018-01-04 MED ORDER — DEXAMETHASONE SODIUM PHOSPHATE 10 MG/ML IJ SOLN
INTRAMUSCULAR | Status: AC
  Filled 2018-01-04: qty 2

## 2018-01-04 MED ORDER — CEFAZOLIN SODIUM-DEXTROSE 2-4 GM/100ML-% IV SOLN
INTRAVENOUS | Status: AC
  Filled 2018-01-04: qty 100

## 2018-01-04 SURGICAL SUPPLY — 62 items
ATTUNE MED DOME PAT 32 KNEE (Knees) ×2 IMPLANT
ATTUNE PS FEM LT SZ 5 CEM KNEE (Femur) ×2 IMPLANT
ATTUNE PSRP INSE SZ5 7 KNEE (Insert) ×2 IMPLANT
BANDAGE ESMARK 6X9 LF (GAUZE/BANDAGES/DRESSINGS) ×1 IMPLANT
BASE TIBIAL ROT PLAT SZ 5 KNEE (Knees) ×1 IMPLANT
BLADE SAGITTAL 25.0X1.19X90 (BLADE) ×2 IMPLANT
BLADE SAW SGTL 13X75X1.27 (BLADE) ×2 IMPLANT
BLADE SURG 10 STRL SS (BLADE) ×10 IMPLANT
BNDG ELASTIC 6X10 VLCR STRL LF (GAUZE/BANDAGES/DRESSINGS) ×2 IMPLANT
BNDG ESMARK 6X9 LF (GAUZE/BANDAGES/DRESSINGS) ×2
BOWL SMART MIX CTS (DISPOSABLE) ×2 IMPLANT
CEMENT HV SMART SET (Cement) ×4 IMPLANT
COVER SURGICAL LIGHT HANDLE (MISCELLANEOUS) ×2 IMPLANT
CUFF TOURNIQUET SINGLE 34IN LL (TOURNIQUET CUFF) ×2 IMPLANT
DECANTER SPIKE VIAL GLASS SM (MISCELLANEOUS) ×2 IMPLANT
DRAPE EXTREMITY T 121X128X90 (DRAPE) ×2 IMPLANT
DRAPE HALF SHEET 40X57 (DRAPES) ×4 IMPLANT
DRAPE INCISE IOBAN 66X45 STRL (DRAPES) IMPLANT
DRAPE ORTHO SPLIT 77X108 STRL (DRAPES) ×1
DRAPE SURG ORHT 6 SPLT 77X108 (DRAPES) ×1 IMPLANT
DRAPE U-SHAPE 47X51 STRL (DRAPES) ×2 IMPLANT
DRSG AQUACEL AG ADV 3.5X10 (GAUZE/BANDAGES/DRESSINGS) ×2 IMPLANT
DURAPREP 26ML APPLICATOR (WOUND CARE) ×2 IMPLANT
ELECT CAUTERY BLADE 6.4 (BLADE) ×2 IMPLANT
ELECT REM PT RETURN 9FT ADLT (ELECTROSURGICAL) ×2
ELECTRODE REM PT RTRN 9FT ADLT (ELECTROSURGICAL) ×1 IMPLANT
FACESHIELD WRAPAROUND (MASK) ×2 IMPLANT
GLOVE BIO SURGEON STRL SZ7 (GLOVE) ×2 IMPLANT
GLOVE BIOGEL PI IND STRL 7.0 (GLOVE) ×1 IMPLANT
GLOVE BIOGEL PI IND STRL 7.5 (GLOVE) ×1 IMPLANT
GLOVE BIOGEL PI INDICATOR 7.0 (GLOVE) ×1
GLOVE BIOGEL PI INDICATOR 7.5 (GLOVE) ×1
GLOVE SS BIOGEL STRL SZ 7.5 (GLOVE) ×1 IMPLANT
GLOVE SUPERSENSE BIOGEL SZ 7.5 (GLOVE) ×1
GOWN STRL REUS W/ TWL LRG LVL3 (GOWN DISPOSABLE) ×1 IMPLANT
GOWN STRL REUS W/ TWL XL LVL3 (GOWN DISPOSABLE) ×1 IMPLANT
GOWN STRL REUS W/TWL LRG LVL3 (GOWN DISPOSABLE) ×1
GOWN STRL REUS W/TWL XL LVL3 (GOWN DISPOSABLE) ×1
HANDPIECE INTERPULSE COAX TIP (DISPOSABLE) ×1
HOOD PEEL AWAY FACE SHEILD DIS (HOOD) ×4 IMPLANT
KIT BASIN OR (CUSTOM PROCEDURE TRAY) ×2 IMPLANT
KIT TURNOVER KIT B (KITS) ×2 IMPLANT
MANIFOLD NEPTUNE II (INSTRUMENTS) ×2 IMPLANT
MARKER SKIN DUAL TIP RULER LAB (MISCELLANEOUS) ×2 IMPLANT
NEEDLE HYPO 22GX1.5 SAFETY (NEEDLE) ×4 IMPLANT
NS IRRIG 1000ML POUR BTL (IV SOLUTION) ×2 IMPLANT
PACK TOTAL JOINT (CUSTOM PROCEDURE TRAY) ×2 IMPLANT
PAD ARMBOARD 7.5X6 YLW CONV (MISCELLANEOUS) ×4 IMPLANT
SET HNDPC FAN SPRY TIP SCT (DISPOSABLE) ×1 IMPLANT
SUCTION FRAZIER HANDLE 10FR (MISCELLANEOUS) ×1
SUCTION TUBE FRAZIER 10FR DISP (MISCELLANEOUS) ×1 IMPLANT
SUT MNCRL AB 3-0 PS2 18 (SUTURE) ×2 IMPLANT
SUT VIC AB 0 CT1 27 (SUTURE) ×2
SUT VIC AB 0 CT1 27XBRD ANBCTR (SUTURE) ×2 IMPLANT
SUT VIC AB 1 CT1 27 (SUTURE) ×1
SUT VIC AB 1 CT1 27XBRD ANBCTR (SUTURE) ×1 IMPLANT
SUT VIC AB 2-0 CT1 27 (SUTURE) ×2
SUT VIC AB 2-0 CT1 TAPERPNT 27 (SUTURE) ×2 IMPLANT
SYR CONTROL 10ML LL (SYRINGE) ×4 IMPLANT
TIBIAL BASE ROT PLAT SZ 5 KNEE (Knees) ×2 IMPLANT
TRAY FOLEY CATH 14FR (SET/KITS/TRAYS/PACK) ×2 IMPLANT
WATER STERILE IRR 1000ML POUR (IV SOLUTION) ×2 IMPLANT

## 2018-01-04 NOTE — Anesthesia Procedure Notes (Signed)
Anesthesia Regional Block: Adductor canal block   Pre-Anesthetic Checklist: ,, timeout performed, Correct Patient, Correct Site, Correct Laterality, Correct Procedure, Correct Position, site marked, Risks and benefits discussed,  Surgical consent,  Pre-op evaluation,  At surgeon's request and post-op pain management  Laterality: Left  Prep: chloraprep       Needles:  Injection technique: Single-shot  Needle Type: Echogenic Stimulator Needle     Needle Length: 9cm  Needle Gauge: 21   Needle insertion depth: 7 cm   Additional Needles:   Procedures:,,,, ultrasound used (permanent image in chart),,,,  Narrative:  Start time: 01/04/2018 8:50 AM End time: 01/04/2018 8:55 AM Injection made incrementally with aspirations every 5 mL.  Performed by: Personally  Anesthesiologist: Josephine Igo, MD  Additional Notes: Timeout performed. Patient sedated. Relevant anatomy ID'd using [place]. Incremental 2-51ml injection of LA with frequent aspiration. Patient tolerated procedure well.        Left Adductor Canal Block

## 2018-01-04 NOTE — Anesthesia Postprocedure Evaluation (Signed)
Anesthesia Post Note  Patient: Jennifer Tran  Procedure(s) Performed: LEFT TOTAL KNEE ARTHROPLASTY (Left Knee)     Patient location during evaluation: PACU Anesthesia Type: Spinal Level of consciousness: oriented and awake and alert Pain management: pain level controlled Vital Signs Assessment: post-procedure vital signs reviewed and stable Respiratory status: spontaneous breathing, respiratory function stable and patient connected to nasal cannula oxygen Cardiovascular status: blood pressure returned to baseline and stable Postop Assessment: no headache, no backache and no apparent nausea or vomiting Anesthetic complications: no    Last Vitals:  Vitals:   01/04/18 1411 01/04/18 1949  BP: 124/75 137/77  Pulse: 74 90  Resp: 18 17  Temp: 36.8 C 36.9 C  SpO2: 92% 93%    Last Pain:  Vitals:   01/04/18 2134  TempSrc:   PainSc: 4    Pain Goal: Patients Stated Pain Goal: 1 (01/04/18 2134)               Bernie Fobes L [name]

## 2018-01-04 NOTE — Care Plan (Signed)
Patient known to me from prior surgery. Met with her in the office. Will discharge to home with family and the following arrangements;   HHPT: [facility] at [facility] visits  DME- Has all needed - CPM to be delivered by [employer]  OPPT: 01/18/18 @ [facility] location  MD Follow up:  Dr Noemi Chapel - 01/18/18 @ 430   Please contact [place], [place] [phone] with questions or if this plan needs to change    Thanks

## 2018-01-04 NOTE — Evaluation (Signed)
Physical Therapy Evaluation Patient Details Name: Jennifer Tran MRN: [id number] DOB: 03/12/1946 Today's Date: 01/04/2018   History of Present Illness  pt is a 72 y/o female with pmh fo OA of right knee, s/p R TKA, HTN, admitted for elective L TKA.  Clinical Impression  Pt admitted with/for elective surgery.  Pt currently limited functionally due to the problems listed below.  (see problems list.)  Pt will benefit from PT to maximize function and safety to be able to get home safely with available assist.     Follow Up Recommendations Follow surgeon's recommendation for DC plan and follow-up therapies    Equipment Recommendations  None recommended by PT    Recommendations for Other Services       Precautions / Restrictions Precautions Precautions: Knee Required Braces or Orthoses: Knee Immobilizer - Left Knee Immobilizer - Left: Other (comment)(until stable) Restrictions Weight Bearing Restrictions: Yes LLE Weight Bearing: Weight bearing as tolerated      Mobility  Bed Mobility Overal bed mobility: Needs Assistance Bed Mobility: Supine to Sit     Supine to sit: Min guard        Transfers Overall transfer level: Needs assistance   Transfers: Sit to/from Stand Sit to Stand: Min assist         General transfer comment: cues for technique and boost assist  Ambulation/Gait Ambulation/Gait assistance: Min guard;Min assist Gait Distance (Feet): 60 Feet Assistive device: Rolling walker (2 wheeled) Gait Pattern/deviations: Step-to pattern   Gait velocity interpretation: <1.31 ft/sec, indicative of household ambulator General Gait Details: KI due to pt unable to feel knee buckle well.  Generally steady step to gait.  Stairs            Wheelchair Mobility    Modified Rankin (Stroke Patients Only)       Balance Overall balance assessment: No apparent balance deficits (not formally assessed)                                            Pertinent Vitals/Pain Pain Assessment: Faces Faces Pain Scale: Hurts a little bit Pain Location: knee Pain Descriptors / Indicators: Dull;Guarding Pain Intervention(s): Monitored during session    Home Living Family/patient expects to be discharged to:: Private residence Living Arrangements: Spouse/significant other Available Help at Discharge: Available 24 hours/day;Personal care attendant Type of Home: House Home Access: Ramped entrance     Home Layout: One level;Able to live on main level with bedroom/bathroom Home Equipment: Gilford Rile - 2 wheels;Cane - single point;Shower seat;Bedside commode      Prior Function Level of Independence: Independent with assistive device(s)         Comments: cane     Hand Dominance        Extremity/Trunk Assessment   Upper Extremity Assessment Upper Extremity Assessment: Overall WFL for tasks assessed    Lower Extremity Assessment Lower Extremity Assessment: LLE deficits/detail;Overall [facility] for tasks assessed LLE Deficits / Details: weak from nerve block, 110* L knee flexion.       Communication   Communication: No difficulties  Cognition Arousal/Alertness: Awake/alert Behavior During Therapy: WFL for tasks assessed/performed Overall Cognitive Status: Within Functional Limits for tasks assessed  General Comments      Exercises Total Joint Exercises Ankle Circles/Pumps: AROM;10 reps;Supine Quad Sets: 5 reps;Seated;AROM Heel Slides: AROM;Both;Supine Straight Leg Raises: AROM;5 reps;Supine;AAROM Goniometric ROM: 110   Assessment/Plan    PT Assessment Patient needs continued PT services  PT Problem List Decreased strength;Decreased activity tolerance;Decreased mobility;Decreased knowledge of use of DME;Decreased knowledge of precautions;Pain       PT Treatment Interventions Gait training;Functional mobility training;Stair training;DME instruction;Therapeutic  activities;Patient/family education    PT Goals (Current goals can be found in the Care Plan section)  Acute Rehab PT Goals Patient Stated Goal: home with therapies PT Goal Formulation: With patient Time For Goal Achievement: 01/11/18 Potential to Achieve Goals: Good    Frequency 7X/week   Barriers to discharge        Co-evaluation               AM-PAC PT "6 Clicks" Daily Activity  Outcome Measure Difficulty turning over in bed (including adjusting bedclothes, sheets and blankets)?: A Little Difficulty moving from lying on back to sitting on the side of the bed? : A Little Difficulty sitting down on and standing up from a chair with arms (e.g., wheelchair, bedside commode, etc,.)?: Unable Help needed moving to and from a bed to chair (including a wheelchair)?: A Little Help needed walking in hospital room?: A Little Help needed climbing 3-5 steps with a railing? : A Little 6 Click Score: 16    End of Session   Activity Tolerance: Patient tolerated treatment well Patient left: in chair;with call bell/phone within reach;with family/visitor present Nurse Communication: Mobility status PT Visit Diagnosis: Other abnormalities of gait and mobility (R26.89);Pain Pain - Right/Left: Right Pain - part of body: Knee    Time: 1735-1801 PT Time Calculation (min) (ACUTE ONLY): 26 min   Charges:   PT Evaluation $PT Eval Low Complexity: 1 Low PT Treatments $Gait Training: 8-22 mins        01/04/2018  Donnella Sham, PT Acute Rehabilitation Services [phone]  (pager) [phone]  (office)  Tessie Fass Anaston Koehn 01/04/2018, 7:24 PM

## 2018-01-04 NOTE — Anesthesia Preprocedure Evaluation (Addendum)
Anesthesia Evaluation  Patient identified by MRN, date of birth, ID band Patient awake    Reviewed: Allergy & Precautions, H&P , NPO status , Patient's Chart, lab work & pertinent test results, reviewed documented beta blocker date and time   History of Anesthesia Complications (+) history of anesthetic complications  Airway Mallampati: II   Neck ROM: full    Dental no notable dental hx. (+) Teeth Intact   Pulmonary former smoker,    Pulmonary exam normal breath sounds clear to auscultation       Cardiovascular hypertension, Pt. on medications and Pt. on home beta blockers Normal cardiovascular exam Rhythm:Regular Rate:Bradycardia     Neuro/Psych PSYCHIATRIC DISORDERS Anxiety Depression negative neurological ROS     GI/Hepatic Neg liver ROS, GERD  Medicated and Controlled,  Endo/Other  Obesity  Renal/GU negative Renal ROS  negative genitourinary   Musculoskeletal  (+) Arthritis , Osteoarthritis,  OA left knee   Abdominal (+) + obese,   Peds  Hematology   Anesthesia Other Findings   Reproductive/Obstetrics                            Anesthesia Physical  Anesthesia Plan  ASA: II  Anesthesia Plan: Spinal   Post-op Pain Management:  Regional for Post-op pain   Induction: Intravenous  PONV Risk Score and Plan: 2 and Ondansetron, Dexamethasone, Propofol infusion and Treatment may vary due to age or medical condition  Airway Management Planned: Natural Airway and Simple Face Mask  Additional Equipment:   Intra-op Plan:   Post-operative Plan:   Informed Consent: I have reviewed the patients History and Physical, chart, labs and discussed the procedure including the risks, benefits and alternatives for the proposed anesthesia with the patient or authorized representative who has indicated his/her understanding and acceptance.   Dental advisory given  Plan Discussed with: CRNA,  Anesthesiologist and Surgeon  Anesthesia Plan Comments:         Anesthesia Quick Evaluation

## 2018-01-04 NOTE — Anesthesia Procedure Notes (Signed)
Procedure Name: MAC Date/Time: 01/04/2018 8:53 AM Performed by: White, Amedeo Plenty, CRNA Pre-anesthesia Checklist: Patient identified, Emergency Drugs available, Suction available and Patient being monitored Oxygen Delivery Method: Simple face mask

## 2018-01-04 NOTE — Interval H&P Note (Signed)
History and Physical Interval Note:  01/04/2018 8:56 AM  [name]  has presented today for surgery, with the diagnosis of OA LEFT KNEE  The various methods of treatment have been discussed with the patient and family. After consideration of risks, benefits and other options for treatment, the patient has consented to  Procedure(s): LEFT TOTAL KNEE ARTHROPLASTY (Left) as a surgical intervention .  The patient's history has been reviewed, patient examined, no change in status, stable for surgery.  I have reviewed the patient's chart and labs.  Questions were answered to the patient's satisfaction.     Jennifer Tran

## 2018-01-04 NOTE — Anesthesia Postprocedure Evaluation (Deleted)
Anesthesia Post Note  Patient: Jennifer Tran  Procedure(s) Performed: LEFT TOTAL KNEE ARTHROPLASTY (Left Knee)     Anesthesia Post Evaluation  Last Vitals:  Vitals:   01/04/18 1411 01/04/18 1949  BP: 124/75 137/77  Pulse: 74 90  Resp: 18 17  Temp: 36.8 C 36.9 C  SpO2: 92% 93%    Last Pain:  Vitals:   01/04/18 2134  TempSrc:   PainSc: 4    Pain Goal: Patients Stated Pain Goal: 1 (01/04/18 2134)               [place]

## 2018-01-04 NOTE — Transfer of Care (Addendum)
Immediate Anesthesia Transfer of Care Note  Patient: Jennifer Tran  Procedure(s) Performed: LEFT TOTAL KNEE ARTHROPLASTY (Left Knee)  Patient Location: PACU  Anesthesia Type:Spinal  Level of Consciousness: awake, alert  and patient cooperative  Airway & Oxygen Therapy: Patient Spontanous Breathing  Post-op Assessment: Report given to RN and Post -op Vital signs reviewed and stable  Post vital signs: Reviewed and stable  Last Vitals:  Vitals Value Taken Time  BP 116/53 01/04/2018 11:46 AM  Temp    Pulse 81 01/04/2018 11:48 AM  Resp 18 01/04/2018 11:48 AM  SpO2 93 % 01/04/2018 11:48 AM  Vitals shown include unvalidated device data.  Last Pain:  Vitals:   01/04/18 0810  TempSrc:   PainSc: 0-No pain      Patients Stated Pain Goal: 3 (03/75/43 6067)  Complications: No apparent anesthesia complications

## 2018-01-04 NOTE — Op Note (Signed)
MRN:     [id number] DOB/AGE:    08/22/1945 / 72 y.o.       OPERATIVE REPORT   DATE OF PROCEDURE:  01/04/2018      PREOPERATIVE DIAGNOSIS:   Primary Localized Osteoarthritis left Knee       Estimated body mass index is 32.27 kg/m as calculated from the following:   Height as of 12/23/17: 5\' 4"  (1.626 m).   Weight as of this encounter: 85.3 kg.                                                       POSTOPERATIVE DIAGNOSIS:   Same                                                                 PROCEDURE:  Procedure(s): LEFT TOTAL KNEE ARTHROPLASTY Using Depuy Attune RP implants #5 Femur, #5Tibia, 52mm  RP bearing, 32 Patella    SURGEON: Belmira Daley A. Noemi Chapel, MD   ASSISTANT: Matthew Saras, PA-C, present and scrubbed throughout the case, critical for retraction, instrumentation, and closure.  ANESTHESIA: Spinal with Adductor Nerve Block  TOURNIQUET TIME: 58 minutes   COMPLICATIONS:  None       SPECIMENS: None   INDICATIONS FOR PROCEDURE: The patient has djd of the knee with varus deformities, XR shows bone on bone arthritis. Patient has failed all conservative measures including anti-inflammatory medicines, narcotics, attempts at exercise and weight loss, cortisone injections and viscosupplementation.  Risks and benefits of surgery have been discussed, questions answered.    DESCRIPTION OF PROCEDURE: The patient identified by armband, received right adductor canal block and IV antibiotics, in the holding area at [facility]. Patient taken to the operating room, appropriate anesthetic monitors were attached. Spinal anesthesia induced with the patient in supine position, Foley catheter was inserted. Tourniquet applied high to the operative thigh. Lateral post and foot positioner applied to the table, the lower extremity was then prepped and draped in usual sterile fashion from the ankle to the tourniquet. Time-out procedure was performed. The limb was wrapped with an Esmarch bandage and  the tourniquet inflated to 365 mmHg.   We began the operation by making a 6cm anterior midline incision. Small bleeders in the skin and the subcutaneous tissue identified and cauterized. Transverse retinaculum was incised and reflected medially and a medial parapatellar arthrotomy was accomplished. the patella was everted and theprepatellar fat pad resected. The superficial medial collateral ligament was then elevated from anterior to posterior along the proximal flare of the tibia and anterior half of the menisci resected. The knee was hyperflexed exposing bone on bone arthritis. Peripheral and notch osteophytes as well as the cruciate ligaments were then resected. We continued to work our way around posteriorly along the proximal tibia, and externally rotated the tibia subluxing it out from underneath the femur. A McHale retractor was placed through the notch and a lateral Hohmann retractor placed, and an external tibial guide was placed.  The tibial cutting guide was pinned into place allowing resection of 4 mm of bone medially and about 6 mm of bone laterally because of her  varus deformity.   Satisfied with the tibial resection, we then entered the distal femur 2 mm anterior to the PCL origin with the intramedullary guide rod and applied the distal femoral cutting guide set at 46mm, with 5 degrees of valgus. This was pinned along the epicondylar axis. At this point, the distal femoral cut was accomplished without difficulty. We then sized for a 5 femoral component and pinned the guide in 3 degrees of external rotation.The chamfer cutting guide was pinned into place. The anterior, posterior, and chamfer cuts were accomplished without difficulty followed by the  RP box cutting guide and the box cut. We also removed posterior osteophytes from the posterior femoral condyles. At this time, the knee was brought into full extension. We checked our extension and flexion gaps and found them symmetric at 7.  The  patella thickness measured at 54m m. We set the cutting guide at 15 and removed the posterior patella sized for 32 button and drilled the lollipop. The knee was then once again hyperflexed exposing the proximal tibia. We sized for a # 5 tibial base plate, applied the smokestack and the conical reamer followed by the the Delta fin keel punch. We then hammered into place the  RP trial femoral component, inserted a trial bearing, trial patellar button, and took the knee through range of motion from 0-130 degrees. No thumb pressure was required for patellar tracking.   At this point, all trial components were removed, a double batch of DePuy HV cement  was mixed and applied to all bony metallic mating surfaces. In order, we hammered into place the tibial tray and removed excess cement, the femoral component and removed excess cement, a 7 mm  RP bearing was inserted, and the knee brought to full extension with compression. The patellar button was clamped into place, and excess cement removed. While the cement cured the wound was irrigated out with normal saline solution pulse lavage, and exparel was injected throughout the knee. Ligament stability and patellar tracking were checked and found to be excellent..   The parapatellar arthrotomy was closed with  #1 Vicryl suture. The subcutaneous tissue with 0 and 2-0 undyed Vicryl suture, and 4-0 Monocryl.. A dressing of Aquaseal, 4 x 4, dressing sponges, Webril, and Ace wrap applied. Needle and sponge count were correct times 2.The patient awakened, extubated, and taken to recovery room without difficulty. Vascular status was normal, pulses 2+ and symmetric.    [name] 07/20/2017, 8:56 AM

## 2018-01-04 NOTE — Progress Notes (Signed)
Orthopedic Tech Progress Note Patient Details:  Jennifer Tran 1946-03-29 [id number]  CPM Left Knee CPM Left Knee: On Left Knee Flexion (Degrees): 90 Left Knee Extension (Degrees): 0 Additional Comments: trapeze bar patient helper  Post Interventions Patient Tolerated: Well Instructions Provided: Care of device Viewed order from doctor's order list Hildred Priest 01/04/2018, 12:29 PM

## 2018-01-05 DIAGNOSIS — K219 Gastro-esophageal reflux disease without esophagitis: Secondary | ICD-10-CM | POA: Diagnosis not present

## 2018-01-05 DIAGNOSIS — M1712 Unilateral primary osteoarthritis, left knee: Secondary | ICD-10-CM | POA: Diagnosis not present

## 2018-01-05 DIAGNOSIS — I10 Essential (primary) hypertension: Secondary | ICD-10-CM | POA: Diagnosis not present

## 2018-01-05 DIAGNOSIS — F419 Anxiety disorder, unspecified: Secondary | ICD-10-CM | POA: Diagnosis not present

## 2018-01-05 DIAGNOSIS — M25762 Osteophyte, left knee: Secondary | ICD-10-CM | POA: Diagnosis not present

## 2018-01-05 DIAGNOSIS — E669 Obesity, unspecified: Secondary | ICD-10-CM | POA: Diagnosis not present

## 2018-01-05 LAB — CBC
HEMATOCRIT: 37 % (ref 36.0–46.0)
HEMOGLOBIN: 11.9 g/dL — AB (ref 12.0–15.0)
MCH: 28.9 pg (ref 26.0–34.0)
MCHC: 32.2 g/dL (ref 30.0–36.0)
MCV: 89.8 fL (ref 78.0–100.0)
Platelets: 287 10*3/uL (ref 150–400)
RBC: 4.12 MIL/uL (ref 3.87–5.11)
RDW: 12.3 % (ref 11.5–15.5)
WBC: 17.1 10*3/uL — ABNORMAL HIGH (ref 4.0–10.5)

## 2018-01-05 LAB — BASIC METABOLIC PANEL
Anion gap: 6 (ref 5–15)
BUN: 16 mg/dL (ref 8–23)
CHLORIDE: 109 mmol/L (ref 98–111)
CO2: 24 mmol/L (ref 22–32)
CREATININE: 0.95 mg/dL (ref 0.44–1.00)
Calcium: 8.7 mg/dL — ABNORMAL LOW (ref 8.9–10.3)
GFR calc Af Amer: >60 mL/min (ref >60)
GFR calc non Af Amer: 59 mL/min — ABNORMAL LOW (ref >60)
Glucose, Bld: 189 mg/dL — ABNORMAL HIGH (ref 70–99)
Potassium: 4.7 mmol/L (ref 3.5–5.1)
Sodium: 139 mmol/L (ref 135–145)

## 2018-01-05 MED ORDER — DOCUSATE SODIUM 100 MG PO CAPS: ORAL_CAPSULE | ORAL | 0 refills | Status: AC

## 2018-01-05 MED ORDER — POLYETHYLENE GLYCOL 3350 17 G PO PACK: PACK | ORAL | 0 refills | Status: AC

## 2018-01-05 MED ORDER — ASPIRIN 325 MG PO TBEC: DELAYED_RELEASE_TABLET | ORAL | 0 refills | Status: AC

## 2018-01-05 MED ORDER — GABAPENTIN 300 MG PO CAPS: ORAL_CAPSULE | ORAL | 0 refills | Status: AC

## 2018-01-05 MED ORDER — BUPIVACAINE IN DEXTROSE 0.75-8.25 % IT SOLN
INTRATHECAL | Status: DC | PRN
  Administered 2018-01-04: 1.7 mL via INTRATHECAL

## 2018-01-05 MED ORDER — OXYCODONE HCL 5 MG PO TABS: 5.0000 mg | ORAL_TABLET | ORAL | 0 refills | Status: AC | PRN

## 2018-01-05 NOTE — Anesthesia Procedure Notes (Signed)
Spinal  Patient location during procedure: OR Start time: 01/05/2018 9:46 AM End time: 01/05/2018 9:50 AM Staffing Anesthesiologist: Josephine Igo, MD Performed: anesthesiologist  Preanesthetic Checklist Completed: patient identified, site marked, surgical consent, pre-op evaluation, timeout performed, IV checked, risks and benefits discussed and monitors and equipment checked Spinal Block Patient position: sitting Prep: site prepped and draped and DuraPrep Patient monitoring: heart rate, cardiac monitor, continuous pulse ox and blood pressure Approach: midline Location: L4-5 Injection technique: single-shot Needle Needle type: Sprotte  Needle gauge: 24 G Needle length: 9 cm Catheter at skin depth: 7 cm Assessment Sensory level: T4 Additional Notes Patient tolerated procedure well. Adequate sensory level.

## 2018-01-05 NOTE — Care Management Note (Signed)
Case Management Note  Patient Details  Name: ELSI STELZER MRN: [id number] Date of Birth: [birth year]  Subjective/Objective:   L TKA                  Action/Plan: 01/04/2018  [place], [place] 01/04/2018        Patient known to me from prior surgery. Met with her in the office. Will discharge to home with family and the following arrangements;   HHPT: [facility] [place] visits  DME- Has all needed - CPM to be delivered by [employer]  OPPT: 01/18/18 @ [facility] location  MD Follow up: Dr Noemi Chapel - 01/18/18 @ 430   Please contact [place], [place] with questions or if this plan needs to change    Thanks                  Expected Discharge Date:                  Expected Discharge Plan:  [place]  In-House Referral:  NA  Discharge planning Services  CM Consult  Post Acute Care Choice:  Home Health Choice offered to:  Patient  DME Arranged:  3-N-1, Walker rolling, CPM DME Agency:  [employer]  HH Arranged:  PT [place]:  [facility] [employer]  Status of Service:  Completed, signed off  If discussed at [employer] [facility] [employer], dates discussed:    Additional Comments:  Erenest Rasher, RN 01/05/2018, 11:49 AM

## 2018-01-05 NOTE — Progress Notes (Signed)
Physical Therapy Treatment Patient Details Name: Jennifer Tran MRN: [id number] DOB: 10/14/1945 Today's Date: 01/05/2018    History of Present Illness pt is a 72 y/o female with pmh fo OA of right knee, s/p R TKA, HTN, admitted for elective L TKA.    PT Comments    Pt performed gait training and functional mobility with minor cues for correct techniques.  Pt able to progress to stair training and performed multiple reps. Pt is more than ready to return home with support from her daughter.  Educated patient on frequency and use of HEP at home to recover.  Informed nursing that patient is ready for d/c home.    Follow Up Recommendations  Follow surgeon's recommendation for DC plan and follow-up therapies     Equipment Recommendations  None recommended by PT    Recommendations for Other Services       Precautions / Restrictions Precautions Precautions: Knee Restrictions Weight Bearing Restrictions: Yes LLE Weight Bearing: Weight bearing as tolerated    Mobility  Bed Mobility Overal bed mobility: Needs Assistance Bed Mobility: Supine to Sit              Transfers Overall transfer level: Needs assistance   Transfers: Sit to/from Stand Sit to Stand: Supervision         General transfer comment: Cues for hand placement and to back completely to seated surface before sitting.    Ambulation/Gait Ambulation/Gait assistance: Supervision Gait Distance (Feet): 450 Feet Assistive device: Rolling walker (2 wheeled) Gait Pattern/deviations: Step-through pattern;Antalgic     General Gait Details: Pty with good progression to step through pattern and maintains great posture.     Stairs Stairs: Yes Stairs assistance: Supervision;Min assist Stair Management: No rails;Two rails;With walker;Forwards;Backwards Number of Stairs: 4 General stair comments: x2 with B rails and x2 with RW.  Performed backwards to ascend with RW, all other techniques performed facing forward.      Wheelchair Mobility    Modified Rankin (Stroke Patients Only)       Balance Overall balance assessment: No apparent balance deficits (not formally assessed)                                          Cognition Arousal/Alertness: Awake/alert Behavior During Therapy: WFL for tasks assessed/performed Overall Cognitive Status: Within Functional Limits for tasks assessed                                        Exercises      General Comments        Pertinent Vitals/Pain Pain Assessment: Faces Faces Pain Scale: Hurts little more Pain Location: knee Pain Descriptors / Indicators: Dull;Guarding Pain Intervention(s): Monitored during session;Repositioned;Ice applied    Home Living                      Prior Function            PT Goals (current goals can now be found in the care plan section) Acute Rehab PT Goals Patient Stated Goal: home with therapies Potential to Achieve Goals: Good Progress towards PT goals: Progressing toward goals    Frequency    7X/week      PT Plan Current plan remains appropriate    Co-evaluation  AM-PAC PT "6 Clicks" Daily Activity  Outcome Measure  Difficulty turning over in bed (including adjusting bedclothes, sheets and blankets)?: A Little Difficulty moving from lying on back to sitting on the side of the bed? : A Little Difficulty sitting down on and standing up from a chair with arms (e.g., wheelchair, bedside commode, etc,.)?: A Little Help needed moving to and from a bed to chair (including a wheelchair)?: A Little Help needed walking in hospital room?: A Little Help needed climbing 3-5 steps with a railing? : A Little 6 Click Score: 18    End of Session Equipment Utilized During Treatment: Gait belt Activity Tolerance: Patient tolerated treatment well Patient left: in chair;with call bell/phone within reach;with family/visitor present Nurse Communication:  Mobility status PT Visit Diagnosis: Other abnormalities of gait and mobility (R26.89);Pain Pain - Right/Left: Right Pain - part of body: Knee     Time: 0932-6712 PT Time Calculation (min) (ACUTE ONLY): 14 min  Charges:  $Gait Training: 8-22 mins                     Governor Rooks, PTA Acute Rehabilitation Services Pager [phone] Office [phone]     Ronit Marczak Eli Hose 01/05/2018, 2:29 PM

## 2018-01-05 NOTE — Discharge Summary (Signed)
Patient ID: Jennifer Tran MRN: [id number] DOB/AGE: 1945/06/21 72 y.o.  Admit date: 01/04/2018 Discharge date: 01/05/2018  Admission Diagnoses:  Active Problems:   Primary osteoarthritis of left knee   Primary localized osteoarthritis of left knee   Discharge Diagnoses:  Same  Past Medical History:  Diagnosis Date  . Anxiety   . Arthritis   . Cancer ([place])    right knee melanoma  1992  . Complication of anesthesia    oxygen sats dropped on floor, nurses stated to take deeper breaths  . GERD (gastroesophageal reflux disease)   . Hypertension   . Leukocytosis 09/15/2017  . 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 Primary localized osteoarthritis of right knee   . Reflux     Surgeries: Procedure(s): LEFT TOTAL KNEE ARTHROPLASTY on 01/04/2018   Consultants:   Discharged Condition: Improved  Hospital Course: Jennifer Tran is an 72 y.o. female who was admitted 01/04/2018 for operative treatment of<principal problem not specified>. Patient has severe unremitting pain that affects sleep, daily activities, and work/hobbies. After pre-op clearance the patient was taken to the operating room on 01/04/2018 and underwent  Procedure(s): LEFT TOTAL KNEE ARTHROPLASTY.    Patient was given perioperative antibiotics:  Anti-infectives (From admission, onward)   Start     Dose/Rate Route Frequency Ordered Stop   01/04/18 1530  ceFAZolin (ANCEF) IVPB 2g/100 mL premix     2 g 200 mL/hr over 30 Minutes Intravenous Every 6 hours 01/04/18 1413 01/04/18 2208   01/04/18 0745  ceFAZolin (ANCEF) IVPB 2g/100 mL premix     2 g 200 mL/hr over 30 Minutes Intravenous On call to O.R. 01/04/18 9622 01/04/18 0953   01/04/18 0736  ceFAZolin (ANCEF) 2-4 GM/100ML-% IVPB    Note to Pharmacy:  Laurita Quint   : cabinet override      01/04/18 0736 01/04/18 0953       Patient was given sequential compression devices, early ambulation, and chemoprophylaxis to prevent DVT.  Patient  benefited maximally from hospital stay and there were no complications.    Recent vital signs:  Patient Vitals for the past 24 hrs:  BP Temp Temp src Pulse Resp SpO2  01/05/18 0005 127/69 98.1 F (36.7 C) Oral 88 16 93 %  01/04/18 1949 137/77 98.5 F (36.9 C) Oral 90 17 93 %     Recent laboratory studies:  Recent Labs    01/05/18 0420  WBC 17.1*  HGB 11.9*  HCT 37.0  PLT 287  NA 139  K 4.7  CL 109  CO2 24  BUN 16  CREATININE 0.95  GLUCOSE 189*  CALCIUM 8.7*     Discharge Medications:   Allergies as of 01/05/2018      Reactions   Ivp Dye [iodinated Diagnostic Agents] Anaphylaxis   Nsaids Swelling, Other (See Comments)   Ankle swelling   Penicillins Rash, Other (See Comments)   This patient has had Ancef IV with no adverse reaction.  Will use again perioperatively    PATIENT HAS HAD A PCN REACTION WITH IMMEDIATE RASH, FACIAL/TONGUE/THROAT SWELLING, SOB, OR LIGHTHEADEDNESS WITH HYPOTENSION:  #  #  YES  #  #  Has patient had a PCN reaction causing severe rash involving mucus membranes or skin necrosis: Unknown Has patient had a PCN reaction that required hospitalization: No Has patient had a PCN reaction occurring within the last 10 years: No   Steri-strip Compound Benzoin [benzoin Compound] Other (See Comments)   Blisters and Skin burning   Tape  Rash   Clear adhesive tape.      Medication List    TAKE these medications   acetaminophen 325 MG tablet Commonly known as:  TYLENOL Take 325 mg by mouth every 6 (six) hours as needed (for pain/headaches.).   ALPRAZolam 0.25 MG tablet Commonly known as:  XANAX Take 0.25 mg by mouth at bedtime as needed for sleep.   aspirin 325 MG EC tablet 1 tab a day for the next 30 days to prevent blood clots   docusate sodium 100 MG capsule Commonly known as:  COLACE 1 tab 2 times a day while on narcotics.  STOOL SOFTENER   DULoxetine 30 MG capsule Commonly known as:  CYMBALTA Take 30 mg by mouth every morning.   famotidine  10 MG tablet Commonly known as:  PEPCID Take 10 mg by mouth every morning.   fluticasone 50 MCG/ACT nasal spray Commonly known as:  FLONASE Place 1 spray into both nostrils every morning.   gabapentin 300 MG capsule Commonly known as:  NEURONTIN 1 po qhs for nerve pain   metoprolol succinate 50 MG 24 hr tablet Commonly known as:  TOPROL-XL Take 50 mg by mouth every morning. Take with or immediately following a meal.   multivitamin with minerals Tabs tablet Take 1 tablet by mouth daily.   mupirocin nasal ointment 2 % Commonly known as:  BACTROBAN Place 1 application into the nose 2 (two) times daily. For the first 5 days of every month   oxyCODONE 5 MG immediate release tablet Commonly known as:  Oxy IR/ROXICODONE Take 1 tablet (5 mg total) by mouth every 4 (four) hours as needed for moderate pain or severe pain. What changed:    how much to take  how to take this  when to take this  reasons to take this  additional instructions   polyethylene glycol packet Commonly known as:  MIRALAX / GLYCOLAX 17grams in 6 oz of water twice a day until bowel movement.  LAXITIVE.  Restart if two days since last bowel movement   Vitamin B-12 500 MCG Subl Place 500 mcg under the tongue daily.   Vitamin D3 2000 units Tabs Take 2,000 Units by mouth daily.            Discharge Care Instructions  (From admission, onward)         Start     Ordered   01/05/18 0000  Change dressing    Comments:  DO NOT REMOVE BANDAGE OVER SURGICAL INCISION.  [place] WHOLE LEG INCLUDING OVER THE WATERPROOF BANDAGE WITH SOAP AND WATER EVERY DAY.   01/05/18 1429          Diagnostic Studies: No results found.  Disposition: Discharge disposition: 01-Home or Self Care       Discharge Instructions    CPM   Complete by:  As directed    Continuous passive motion machine (CPM):      Use the CPM from 0 to 90 for 6 hours per day.       You may break it up into 2 or 3 sessions per day.       Use CPM for 2 weeks or until you are told to stop.   Call MD / Call 911   Complete by:  As directed    If you experience chest pain or shortness of breath, CALL 911 and be transported to the hospital emergency room.  If you develope a fever above 101 F, pus (white drainage) or increased drainage  or redness at the wound, or calf pain, call your surgeon's office.   Change dressing   Complete by:  As directed    DO NOT REMOVE BANDAGE OVER SURGICAL INCISION.  [place] WHOLE LEG INCLUDING OVER THE WATERPROOF BANDAGE WITH SOAP AND WATER EVERY DAY.   Constipation Prevention   Complete by:  As directed    Drink plenty of fluids.  Prune juice may be helpful.  You may use a stool softener, such as Colace (over the counter) 100 mg twice a day.  Use MiraLax (over the counter) for constipation as needed.   Diet - low sodium heart healthy   Complete by:  As directed    Discharge instructions   Complete by:  As directed    INSTRUCTIONS AFTER JOINT REPLACEMENT   Remove items at home which could result in a fall. This includes throw rugs or furniture in walking pathways ICE to the affected joint every three hours while awake for 30 minutes at a time, for at least the first 3-5 days, and then as needed for pain and swelling.  Continue to use ice for pain and swelling. You may notice swelling that will progress down to the foot and ankle.  This is normal after surgery.  Elevate your leg when you are not up walking on it.   Continue to use the breathing machine you got in the hospital (incentive spirometer) which will help keep your temperature down.  It is common for your temperature to cycle up and down following surgery, especially at night when you are not up moving around and exerting yourself.  The breathing machine keeps your lungs expanded and your temperature down.   DIET:  As you were doing prior to hospitalization, we recommend a well-balanced diet.  DRESSING / WOUND CARE / SHOWERING  Keep the surgical  dressing until follow up.  The dressing is water proof, so you can shower without any extra covering.  IF THE DRESSING FALLS OFF or the wound gets wet inside, change the dressing with sterile gauze.  Please use good hand washing techniques before changing the dressing.  Do not use any lotions or creams on the incision until instructed by your surgeon.    ACTIVITY  Increase activity slowly as tolerated, but follow the weight bearing instructions below.   No driving for 6 weeks or until further direction given by your physician.  You cannot drive while taking narcotics.  No lifting or carrying greater than 10 lbs. until further directed by your surgeon. Avoid periods of inactivity such as sitting longer than an hour when not asleep. This helps prevent blood clots.  You may return to work once you are authorized by your doctor.     WEIGHT BEARING   Weight bearing as tolerated with assist device (walker, cane, etc) as directed, use it as long as suggested by your surgeon or therapist, typically at least 2-3 weeks.   EXERCISES  Results after joint replacement surgery are often greatly improved when you follow the exercise, range of motion and muscle strengthening exercises prescribed by your doctor. Safety measures are also important to protect the joint from further injury. Any time any of these exercises cause you to have increased pain or swelling, decrease what you are doing until you are comfortable again and then slowly increase them. If you have problems or questions, call your caregiver or physical therapist for advice.   Rehabilitation is important following a joint replacement. After just a few days of immobilization,  the muscles of the leg can become weakened and shrink (atrophy).  These exercises are designed to build up the tone and strength of the thigh and leg muscles and to improve motion. Often times heat used for twenty to thirty minutes before working out will loosen up your  tissues and help with improving the range of motion but do not use heat for the first two weeks following surgery (sometimes heat can increase post-operative swelling).   These exercises can be done on a training (exercise) mat, on the floor, on a table or on a bed. Use whatever works the best and is most comfortable for you.    Use music or television while you are exercising so that the exercises are a pleasant break in your day. This will make your life better with the exercises acting as a break in your routine that you can look forward to.   Perform all exercises about fifteen times, three times per day or as directed.  You should exercise both the operative leg and the other leg as well.   Exercises include:  Quad Sets - Tighten up the muscle on the front of the thigh (Quad) and hold for 5-10 seconds.   Straight Leg Raises - With your knee straight (if you were given a brace, keep it on), lift the leg to 60 degrees, hold for 3 seconds, and slowly lower the leg.  Perform this exercise against resistance later as your leg gets stronger.  Leg Slides: Lying on your back, slowly slide your foot toward your buttocks, bending your knee up off the floor (only go as far as is comfortable). Then slowly slide your foot back down until your leg is flat on the floor again.  Angel Wings: Lying on your back spread your legs to the side as far apart as you can without causing discomfort.  Hamstring Strength:  Lying on your back, push your heel against the floor with your leg straight by tightening up the muscles of your buttocks.  Repeat, but this time bend your knee to a comfortable angle, and push your heel against the floor.  You may put a pillow under the heel to make it more comfortable if necessary.   A rehabilitation program following joint replacement surgery can speed recovery and prevent re-injury in the future due to weakened muscles. Contact your doctor or a physical therapist for more information on  knee rehabilitation.    CONSTIPATION  Constipation is defined medically as fewer than three stools per week and severe constipation as less than one stool per week.  Even if you have a regular bowel pattern at home, your normal regimen is likely to be disrupted due to multiple reasons following surgery.  Combination of anesthesia, postoperative narcotics, change in appetite and fluid intake all can affect your bowels.   YOU MUST use at least one of the following options; they are listed in order of increasing strength to get the job done.  They are all available over the counter, and you may need to use some, POSSIBLY even all of these options:    Drink plenty of fluids (prune juice may be helpful) and high fiber foods Colace 100 mg by mouth twice a day  Senokot for constipation as directed and as needed Dulcolax (bisacodyl), take with full glass of water  Miralax (polyethylene glycol) once or twice a day as needed.  If you have tried all these things and are unable to have a bowel movement in the  first 3-4 days after surgery call either your surgeon or your primary doctor.    If you experience loose stools or diarrhea, hold the medications until you stool forms back up.  If your symptoms do not get better within 1 week or if they get worse, check with your doctor.  If you experience "the worst abdominal pain ever" or develop nausea or vomiting, please contact the office immediately for further recommendations for treatment.   ITCHING:  If you experience itching with your medications, try taking only a single pain pill, or even half a pain pill at a time.  You can also use Benadryl over the counter for itching or also to help with sleep.   TED HOSE STOCKINGS:  Use stockings on both legs until for at least 2 weeks or as directed by physician office. They may be removed at night for sleeping.  MEDICATIONS:  See your medication summary on the "After Visit Summary" that nursing will review with  you.  You may have some home medications which will be placed on hold until you complete the course of blood thinner medication.  It is important for you to complete the blood thinner medication as prescribed.  PRECAUTIONS:  If you experience chest pain or shortness of breath - call 911 immediately for transfer to the hospital emergency department.   If you develop a fever greater that 101 F, purulent drainage from wound, increased redness or drainage from wound, foul odor from the wound/dressing, or calf pain - CONTACT YOUR SURGEON.                                                   FOLLOW-UP APPOINTMENTS:  If you do not already have a post-op appointment, please call the office for an appointment to be seen by your surgeon.  Guidelines for how soon to be seen are listed in your "After Visit Summary", but are typically between 1-4 weeks after surgery.  OTHER INSTRUCTIONS:   Knee Replacement:  Do not place pillow under knee, focus on keeping the knee straight while resting. CPM instructions: 0-90 degrees, 2 hours in the morning, 2 hours in the afternoon, and 2 hours in the evening. Place foam block, curve side up under heel at all times except when in CPM or when walking.  DO NOT modify, tear, cut, or change the foam block in any way.  MAKE SURE YOU:  Understand these instructions.  Get help right away if you are not doing well or get worse.    Thank you for letting us be a part of your medical care team.  It is a privilege we respect greatly.  We hope these instructions will help you stay on track for a fast and full recovery!   Do not put a pillow under the knee. Place it under the heel.   Complete by:  As directed    Place gray foam block, curve side up under heel at all times except when in CPM or when walking.  DO NOT modify, tear, cut, or change in any way the gray foam block.   Increase activity slowly as tolerated   Complete by:  As directed    Patient may shower   Complete by:  As  directed    Aquacel dressing is water proof    Wash over it and the  whole leg with soap and water at the end of your shower   TED hose   Complete by:  As directed    Use stockings (TED hose) for 2 weeks on both leg(s).  You may remove them at night for sleeping.      Follow-up Information    [facility] Follow up.   Specialty:  [place] Why:  [place] will call to arrange initial visit Contact information: [place] [address] [phone]        Elsie Saas, MD Follow up on 01/18/2018.   Specialty:  Orthopedic Surgery Why:  Physical therapy arrive at 2:30 for 3 pm physical therapy and see Dr Noemi Chapel after PT at 4 or 4:30 Contact information: [place] [address] [phone]            Signed: Linda Hedges 01/05/2018, 2:31 PM

## 2018-01-05 NOTE — Addendum Note (Signed)
Addendum  created 01/05/18 0807 by Josephine Igo, MD   Child order released for a procedure order, Intraprocedure Blocks edited, Intraprocedure Meds edited, Sign clinical note

## 2018-01-05 NOTE — Progress Notes (Signed)
Pt and daughter given all verbal  discharge instructions as well as written copies of instructions. Pt and daughter verbalized understanding of all orders. Pt discharges home via wc to car with daughter and and with all belongings.  Pt is not having any pain and has walked around the unit prior to discharge with steady gait.

## 2018-01-06 ENCOUNTER — Encounter (HOSPITAL_COMMUNITY): Payer: Self-pay | Admitting: Orthopedic Surgery

## 2018-01-06 DIAGNOSIS — F419 Anxiety disorder, unspecified: Secondary | ICD-10-CM | POA: Diagnosis not present

## 2018-01-06 DIAGNOSIS — Z96653 Presence of artificial knee joint, bilateral: Secondary | ICD-10-CM | POA: Diagnosis not present

## 2018-01-06 DIAGNOSIS — Z87891 Personal history of nicotine dependence: Secondary | ICD-10-CM | POA: Diagnosis not present

## 2018-01-06 DIAGNOSIS — Z85820 Personal history of malignant melanoma of skin: Secondary | ICD-10-CM | POA: Diagnosis not present

## 2018-01-06 DIAGNOSIS — Z7982 Long term (current) use of aspirin: Secondary | ICD-10-CM | POA: Diagnosis not present

## 2018-01-06 DIAGNOSIS — Z471 Aftercare following joint replacement surgery: Secondary | ICD-10-CM | POA: Diagnosis not present

## 2018-01-06 DIAGNOSIS — K219 Gastro-esophageal reflux disease without esophagitis: Secondary | ICD-10-CM | POA: Diagnosis not present

## 2018-01-06 DIAGNOSIS — I10 Essential (primary) hypertension: Secondary | ICD-10-CM | POA: Diagnosis not present

## 2018-01-08 DIAGNOSIS — I10 Essential (primary) hypertension: Secondary | ICD-10-CM | POA: Diagnosis not present

## 2018-01-08 DIAGNOSIS — F419 Anxiety disorder, unspecified: Secondary | ICD-10-CM | POA: Diagnosis not present

## 2018-01-08 DIAGNOSIS — Z96653 Presence of artificial knee joint, bilateral: Secondary | ICD-10-CM | POA: Diagnosis not present

## 2018-01-08 DIAGNOSIS — Z471 Aftercare following joint replacement surgery: Secondary | ICD-10-CM | POA: Diagnosis not present

## 2018-01-08 DIAGNOSIS — K219 Gastro-esophageal reflux disease without esophagitis: Secondary | ICD-10-CM | POA: Diagnosis not present

## 2018-01-08 DIAGNOSIS — Z7982 Long term (current) use of aspirin: Secondary | ICD-10-CM | POA: Diagnosis not present

## 2018-01-11 DIAGNOSIS — I10 Essential (primary) hypertension: Secondary | ICD-10-CM | POA: Diagnosis not present

## 2018-01-11 DIAGNOSIS — Z471 Aftercare following joint replacement surgery: Secondary | ICD-10-CM | POA: Diagnosis not present

## 2018-01-11 DIAGNOSIS — F419 Anxiety disorder, unspecified: Secondary | ICD-10-CM | POA: Diagnosis not present

## 2018-01-11 DIAGNOSIS — Z96653 Presence of artificial knee joint, bilateral: Secondary | ICD-10-CM | POA: Diagnosis not present

## 2018-01-11 DIAGNOSIS — Z7982 Long term (current) use of aspirin: Secondary | ICD-10-CM | POA: Diagnosis not present

## 2018-01-11 DIAGNOSIS — K219 Gastro-esophageal reflux disease without esophagitis: Secondary | ICD-10-CM | POA: Diagnosis not present

## 2018-01-13 DIAGNOSIS — I10 Essential (primary) hypertension: Secondary | ICD-10-CM | POA: Diagnosis not present

## 2018-01-13 DIAGNOSIS — Z96653 Presence of artificial knee joint, bilateral: Secondary | ICD-10-CM | POA: Diagnosis not present

## 2018-01-13 DIAGNOSIS — K219 Gastro-esophageal reflux disease without esophagitis: Secondary | ICD-10-CM | POA: Diagnosis not present

## 2018-01-13 DIAGNOSIS — Z7982 Long term (current) use of aspirin: Secondary | ICD-10-CM | POA: Diagnosis not present

## 2018-01-13 DIAGNOSIS — F419 Anxiety disorder, unspecified: Secondary | ICD-10-CM | POA: Diagnosis not present

## 2018-01-13 DIAGNOSIS — Z471 Aftercare following joint replacement surgery: Secondary | ICD-10-CM | POA: Diagnosis not present

## 2018-01-14 DIAGNOSIS — Z96653 Presence of artificial knee joint, bilateral: Secondary | ICD-10-CM | POA: Diagnosis not present

## 2018-01-14 DIAGNOSIS — I10 Essential (primary) hypertension: Secondary | ICD-10-CM | POA: Diagnosis not present

## 2018-01-14 DIAGNOSIS — Z471 Aftercare following joint replacement surgery: Secondary | ICD-10-CM | POA: Diagnosis not present

## 2018-01-14 DIAGNOSIS — F419 Anxiety disorder, unspecified: Secondary | ICD-10-CM | POA: Diagnosis not present

## 2018-01-14 DIAGNOSIS — K219 Gastro-esophageal reflux disease without esophagitis: Secondary | ICD-10-CM | POA: Diagnosis not present

## 2018-01-14 DIAGNOSIS — Z7982 Long term (current) use of aspirin: Secondary | ICD-10-CM | POA: Diagnosis not present

## 2018-01-18 DIAGNOSIS — M6281 Muscle weakness (generalized): Secondary | ICD-10-CM | POA: Diagnosis not present

## 2018-01-18 DIAGNOSIS — R262 Difficulty in walking, not elsewhere classified: Secondary | ICD-10-CM | POA: Diagnosis not present

## 2018-01-18 DIAGNOSIS — M1712 Unilateral primary osteoarthritis, left knee: Secondary | ICD-10-CM | POA: Diagnosis not present

## 2018-01-18 DIAGNOSIS — M25562 Pain in left knee: Secondary | ICD-10-CM | POA: Diagnosis not present

## 2018-01-18 DIAGNOSIS — M25662 Stiffness of left knee, not elsewhere classified: Secondary | ICD-10-CM | POA: Diagnosis not present

## 2018-01-20 DIAGNOSIS — M25562 Pain in left knee: Secondary | ICD-10-CM | POA: Diagnosis not present

## 2018-01-20 DIAGNOSIS — M25662 Stiffness of left knee, not elsewhere classified: Secondary | ICD-10-CM | POA: Diagnosis not present

## 2018-01-20 DIAGNOSIS — M6281 Muscle weakness (generalized): Secondary | ICD-10-CM | POA: Diagnosis not present

## 2018-01-20 DIAGNOSIS — R262 Difficulty in walking, not elsewhere classified: Secondary | ICD-10-CM | POA: Diagnosis not present

## 2018-01-22 DIAGNOSIS — M25662 Stiffness of left knee, not elsewhere classified: Secondary | ICD-10-CM | POA: Diagnosis not present

## 2018-01-22 DIAGNOSIS — M25562 Pain in left knee: Secondary | ICD-10-CM | POA: Diagnosis not present

## 2018-01-22 DIAGNOSIS — M6281 Muscle weakness (generalized): Secondary | ICD-10-CM | POA: Diagnosis not present

## 2018-01-22 DIAGNOSIS — R262 Difficulty in walking, not elsewhere classified: Secondary | ICD-10-CM | POA: Diagnosis not present

## 2018-01-25 DIAGNOSIS — M25562 Pain in left knee: Secondary | ICD-10-CM | POA: Diagnosis not present

## 2018-01-25 DIAGNOSIS — M6281 Muscle weakness (generalized): Secondary | ICD-10-CM | POA: Diagnosis not present

## 2018-01-25 DIAGNOSIS — M25662 Stiffness of left knee, not elsewhere classified: Secondary | ICD-10-CM | POA: Diagnosis not present

## 2018-01-25 DIAGNOSIS — R262 Difficulty in walking, not elsewhere classified: Secondary | ICD-10-CM | POA: Diagnosis not present

## 2018-01-27 DIAGNOSIS — M25562 Pain in left knee: Secondary | ICD-10-CM | POA: Diagnosis not present

## 2018-01-27 DIAGNOSIS — M6281 Muscle weakness (generalized): Secondary | ICD-10-CM | POA: Diagnosis not present

## 2018-01-27 DIAGNOSIS — M25662 Stiffness of left knee, not elsewhere classified: Secondary | ICD-10-CM | POA: Diagnosis not present

## 2018-01-27 DIAGNOSIS — R262 Difficulty in walking, not elsewhere classified: Secondary | ICD-10-CM | POA: Diagnosis not present

## 2018-01-29 DIAGNOSIS — M25562 Pain in left knee: Secondary | ICD-10-CM | POA: Diagnosis not present

## 2018-01-29 DIAGNOSIS — M25662 Stiffness of left knee, not elsewhere classified: Secondary | ICD-10-CM | POA: Diagnosis not present

## 2018-01-29 DIAGNOSIS — M6281 Muscle weakness (generalized): Secondary | ICD-10-CM | POA: Diagnosis not present

## 2018-01-29 DIAGNOSIS — R262 Difficulty in walking, not elsewhere classified: Secondary | ICD-10-CM | POA: Diagnosis not present

## 2018-02-02 DIAGNOSIS — R262 Difficulty in walking, not elsewhere classified: Secondary | ICD-10-CM | POA: Diagnosis not present

## 2018-02-02 DIAGNOSIS — M25562 Pain in left knee: Secondary | ICD-10-CM | POA: Diagnosis not present

## 2018-02-02 DIAGNOSIS — M25662 Stiffness of left knee, not elsewhere classified: Secondary | ICD-10-CM | POA: Diagnosis not present

## 2018-02-02 DIAGNOSIS — M6281 Muscle weakness (generalized): Secondary | ICD-10-CM | POA: Diagnosis not present

## 2018-02-04 DIAGNOSIS — R262 Difficulty in walking, not elsewhere classified: Secondary | ICD-10-CM | POA: Diagnosis not present

## 2018-02-04 DIAGNOSIS — M25562 Pain in left knee: Secondary | ICD-10-CM | POA: Diagnosis not present

## 2018-02-04 DIAGNOSIS — M6281 Muscle weakness (generalized): Secondary | ICD-10-CM | POA: Diagnosis not present

## 2018-02-04 DIAGNOSIS — M25662 Stiffness of left knee, not elsewhere classified: Secondary | ICD-10-CM | POA: Diagnosis not present

## 2018-02-09 DIAGNOSIS — M25662 Stiffness of left knee, not elsewhere classified: Secondary | ICD-10-CM | POA: Diagnosis not present

## 2018-02-09 DIAGNOSIS — M25562 Pain in left knee: Secondary | ICD-10-CM | POA: Diagnosis not present

## 2018-02-09 DIAGNOSIS — M6281 Muscle weakness (generalized): Secondary | ICD-10-CM | POA: Diagnosis not present

## 2018-02-09 DIAGNOSIS — R262 Difficulty in walking, not elsewhere classified: Secondary | ICD-10-CM | POA: Diagnosis not present

## 2018-02-11 DIAGNOSIS — M25562 Pain in left knee: Secondary | ICD-10-CM | POA: Diagnosis not present

## 2018-02-11 DIAGNOSIS — M6281 Muscle weakness (generalized): Secondary | ICD-10-CM | POA: Diagnosis not present

## 2018-02-11 DIAGNOSIS — R262 Difficulty in walking, not elsewhere classified: Secondary | ICD-10-CM | POA: Diagnosis not present

## 2018-02-11 DIAGNOSIS — M25662 Stiffness of left knee, not elsewhere classified: Secondary | ICD-10-CM | POA: Diagnosis not present

## 2018-02-15 DIAGNOSIS — M25562 Pain in left knee: Secondary | ICD-10-CM | POA: Diagnosis not present

## 2018-02-16 DIAGNOSIS — M25662 Stiffness of left knee, not elsewhere classified: Secondary | ICD-10-CM | POA: Diagnosis not present

## 2018-02-16 DIAGNOSIS — R262 Difficulty in walking, not elsewhere classified: Secondary | ICD-10-CM | POA: Diagnosis not present

## 2018-02-16 DIAGNOSIS — M6281 Muscle weakness (generalized): Secondary | ICD-10-CM | POA: Diagnosis not present

## 2018-02-16 DIAGNOSIS — M25562 Pain in left knee: Secondary | ICD-10-CM | POA: Diagnosis not present

## 2018-03-30 DIAGNOSIS — M25562 Pain in left knee: Secondary | ICD-10-CM | POA: Diagnosis not present

## 2018-05-18 DIAGNOSIS — Z1231 Encounter for screening mammogram for malignant neoplasm of breast: Secondary | ICD-10-CM | POA: Diagnosis not present

## 2018-05-27 DIAGNOSIS — L304 Erythema intertrigo: Secondary | ICD-10-CM | POA: Diagnosis not present

## 2018-05-27 DIAGNOSIS — Z85820 Personal history of malignant melanoma of skin: Secondary | ICD-10-CM | POA: Diagnosis not present

## 2018-05-27 DIAGNOSIS — Z85828 Personal history of other malignant neoplasm of skin: Secondary | ICD-10-CM | POA: Diagnosis not present

## 2018-05-27 DIAGNOSIS — Z08 Encounter for follow-up examination after completed treatment for malignant neoplasm: Secondary | ICD-10-CM | POA: Diagnosis not present

## 2018-05-27 DIAGNOSIS — L570 Actinic keratosis: Secondary | ICD-10-CM | POA: Diagnosis not present

## 2018-06-12 ENCOUNTER — Other Ambulatory Visit: Payer: Self-pay

## 2018-06-12 ENCOUNTER — Encounter (HOSPITAL_BASED_OUTPATIENT_CLINIC_OR_DEPARTMENT_OTHER): Payer: Self-pay | Admitting: Emergency Medicine

## 2018-06-12 ENCOUNTER — Emergency Department (HOSPITAL_BASED_OUTPATIENT_CLINIC_OR_DEPARTMENT_OTHER)
Admission: EM | Admit: 2018-06-12 | Discharge: 2018-06-12 | Disposition: A | Discharge status: Home / Self Care | Payer: Medicare Other | Attending: Emergency Medicine | Admitting: Emergency Medicine

## 2018-06-12 DIAGNOSIS — Z79899 Other long term (current) drug therapy: Secondary | ICD-10-CM | POA: Diagnosis not present

## 2018-06-12 DIAGNOSIS — A09 Infectious gastroenteritis and colitis, unspecified: Secondary | ICD-10-CM | POA: Diagnosis not present

## 2018-06-12 DIAGNOSIS — Z87891 Personal history of nicotine dependence: Secondary | ICD-10-CM | POA: Insufficient documentation

## 2018-06-12 DIAGNOSIS — R197 Diarrhea, unspecified: Secondary | ICD-10-CM | POA: Insufficient documentation

## 2018-06-12 DIAGNOSIS — Z96653 Presence of artificial knee joint, bilateral: Secondary | ICD-10-CM | POA: Insufficient documentation

## 2018-06-12 DIAGNOSIS — I10 Essential (primary) hypertension: Secondary | ICD-10-CM | POA: Diagnosis not present

## 2018-06-12 DIAGNOSIS — R000 Tachycardia, unspecified: Secondary | ICD-10-CM | POA: Diagnosis not present

## 2018-06-12 DIAGNOSIS — Z7982 Long term (current) use of aspirin: Secondary | ICD-10-CM | POA: Insufficient documentation

## 2018-06-12 LAB — COMPREHENSIVE METABOLIC PANEL
ALK PHOS: 99 U/L (ref 38–126)
ALT: 21 U/L (ref 0–44)
ANION GAP: 7 (ref 5–15)
AST: 17 U/L (ref 15–41)
Albumin: 3.7 g/dL (ref 3.5–5.0)
BUN: 16 mg/dL (ref 8–23)
CALCIUM: 8.8 mg/dL — AB (ref 8.9–10.3)
CO2: 24 mmol/L (ref 22–32)
Chloride: 106 mmol/L (ref 98–111)
Creatinine, Ser: 1.07 mg/dL — ABNORMAL HIGH (ref 0.44–1.00)
GFR, EST NON AFRICAN AMERICAN: 52 mL/min — AB (ref >60)
Glucose, Bld: 91 mg/dL (ref 70–99)
Potassium: 3.5 mmol/L (ref 3.5–5.1)
SODIUM: 137 mmol/L (ref 135–145)
TOTAL PROTEIN: 6.5 g/dL (ref 6.5–8.1)
Total Bilirubin: 0.7 mg/dL (ref 0.3–1.2)

## 2018-06-12 LAB — URINALYSIS, ROUTINE W REFLEX MICROSCOPIC
Bilirubin Urine: NEGATIVE
GLUCOSE, UA: NEGATIVE mg/dL
KETONES UR: NEGATIVE mg/dL
NITRITE: NEGATIVE
PROTEIN: NEGATIVE mg/dL
Specific Gravity, Urine: 1.025 (ref 1.005–1.030)
pH: 6 (ref 5.0–8.0)

## 2018-06-12 LAB — CBC
HCT: 46.1 % — ABNORMAL HIGH (ref 36.0–46.0)
Hemoglobin: 14.1 g/dL (ref 12.0–15.0)
MCH: 27.6 pg (ref 26.0–34.0)
MCHC: 30.6 g/dL (ref 30.0–36.0)
MCV: 90.4 fL (ref 80.0–100.0)
NRBC: 0 % (ref 0.0–0.2)
PLATELETS: 371 10*3/uL (ref 150–400)
RBC: 5.1 MIL/uL (ref 3.87–5.11)
RDW: 12.8 % (ref 11.5–15.5)
WBC: 14.7 10*3/uL — AB (ref 4.0–10.5)

## 2018-06-12 LAB — URINALYSIS, MICROSCOPIC (REFLEX)

## 2018-06-12 LAB — LIPASE, BLOOD: Lipase: 24 U/L (ref 11–51)

## 2018-06-12 MED ORDER — SODIUM CHLORIDE 0.9 % IV BOLUS
1000.0000 mL | Freq: Once | INTRAVENOUS | Status: AC
  Administered 2018-06-12: 1000 mL via INTRAVENOUS

## 2018-06-12 NOTE — ED Notes (Addendum)
Pt c/o diarrhea x 2 weeks, with some formed stool and coffee ground type stool with mucus. Pt denies abdominal pain, denies N/V, no fever reported since 1st day of diarrhea. Pt reports loss of appetite and limited on fluid intake due to frequent bowel movements with intake. Pt states not passing red blood in stool.

## 2018-06-12 NOTE — ED Notes (Addendum)
Pt not able to provide stool sample at this time.

## 2018-06-12 NOTE — ED Provider Notes (Signed)
[facility] EMERGENCY DEPARTMENT Provider Note   CSN: [id number] Arrival date & time: 06/12/18  1812     History   Chief Complaint Chief Complaint  Patient presents with  . Diarrhea    HPI Jennifer Tran is a 73 y.o. female.  HPI  73 year old female presents with diarrhea and near syncope.  She is been having diarrhea for about 10 days and it has been about 6 or 8 episodes per day.  No blood.  The patient denies any abdominal pain.  She is had some on and off nausea throughout this time but none currently.  Today when she was waking up this afternoon she swung her legs over the bed and felt acutely lightheaded.  After she rested it went away.  She is worried about dehydration.  No fevers during this time.  She took clindamycin a couple times for dental procedure after knee replacement in the middle of January.  No traveling anywhere.  Past Medical History:  Diagnosis Date  . Anxiety   . Arthritis   . Cancer ([place])    right knee melanoma  1992  . Complication of anesthesia    oxygen sats dropped on floor, nurses stated to take deeper breaths  . GERD (gastroesophageal reflux disease)   . Hypertension   . Leukocytosis 09/15/2017  . 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 Primary localized osteoarthritis of right knee   . Reflux     Patient Active Problem List   Diagnosis Date Noted  . Primary localized osteoarthritis of left knee 01/05/2018  . Primary osteoarthritis of left knee 01/04/2018  . Leukocytosis 09/15/2017  . 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 Primary localized osteoarthritis of right knee   . Anxiety   . GERD (gastroesophageal reflux disease)     Past Surgical History:  Procedure Laterality Date  . BREAST SURGERY     right lumpectomy early 2000  . CHOLECYSTECTOMY    . 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 TOTAL KNEE ARTHROPLASTY Left 01/04/2018   Procedure: LEFT TOTAL KNEE ARTHROPLASTY;  Surgeon: Elsie Saas, MD;  Location: [facility];  Service: Orthopedics;  Laterality: Left;     OB History   No obstetric history on file.      Home Medications    Prior to Admission medications   Medication Sig Start Date End Date Taking? Authorizing Provider  acetaminophen (TYLENOL) 325 MG tablet Take 325 mg by mouth every 6 (six) hours as needed (for pain/headaches.).    [provider]  ALPRAZolam Duanne Moron) 0.25 MG tablet Take 0.25 mg by mouth at bedtime as needed for sleep.     [provider]  aspirin 325 MG EC tablet 1 tab a day for the next 30 days to prevent blood clots 01/05/18   Shepperson, Kirstin, PA-C  Cholecalciferol (VITAMIN D3) 2000 units TABS Take 2,000 Units by mouth daily.    [provider]  Cyanocobalamin (VITAMIN B-12) 500 MCG SUBL Place 500 mcg under the tongue daily.    [provider]  docusate sodium (COLACE) 100 MG capsule 1 tab 2 times a day while on narcotics.  STOOL SOFTENER 01/05/18   Shepperson, Kirstin, PA-C  DULoxetine (CYMBALTA) 30 MG capsule Take 30 mg by mouth every morning.  08/07/17   [provider]  famotidine (PEPCID) 10 MG tablet Take 10 mg by  mouth every morning.     [provider]  fluticasone (FLONASE) 50 MCG/ACT nasal spray Place 1 spray into both nostrils every morning.     [provider]  gabapentin (NEURONTIN) 300 MG capsule 1 po qhs for nerve pain 01/05/18   Shepperson, Kirstin, PA-C  metoprolol succinate (TOPROL-XL) 50 MG 24 hr tablet Take 50 mg by mouth every morning. Take with or immediately following a meal.     [provider]  Multiple Vitamin (MULTIVITAMIN WITH MINERALS) TABS tablet Take 1 tablet by mouth daily.     [provider]  mupirocin nasal ointment (BACTROBAN) 2 % Place 1 application into the nose 2 (two) times daily.  For the first 5 days of every month    [provider]  oxyCODONE (OXY IR/ROXICODONE) 5 MG immediate release tablet Take 1 tablet (5 mg total) by mouth every 4 (four) hours as needed for moderate pain or severe pain. 01/05/18   Shepperson, Kirstin, PA-C  polyethylene glycol (MIRALAX / GLYCOLAX) packet 17grams in 6 oz of water twice a day until bowel movement.  LAXITIVE.  Restart if two days since last bowel movement 01/05/18   Shepperson, Kirstin, PA-C    Family History No family history on file.  Social History Social History   Tobacco Use  . Smoking status: Former Smoker    Last attempt to quit: 09/03/1973    Years since quitting: 44.8  . Smokeless tobacco: Never Used  Substance Use Topics  . Alcohol use: No  . Drug use: Never     Allergies   Ivp dye [iodinated diagnostic agents]; Nsaids; Penicillins; Steri-strip compound benzoin [benzoin compound]; and Tape   Review of Systems Review of Systems  Constitutional: Negative for fever.  Gastrointestinal: Positive for diarrhea. Negative for abdominal pain, blood in stool and vomiting.  Neurological: Positive for light-headedness.  All other systems reviewed and are negative.    Physical Exam Updated Vital Signs BP (!) 152/90 (BP Location: Right Arm)   Pulse (!) 110   Temp 98.2 F (36.8 C) (Oral)   Resp 18   Ht 5\' 4"  (1.626 m)   Wt 87.1 kg   SpO2 94%   BMI 32.96 kg/m   Physical Exam Vitals signs and nursing note reviewed.  Constitutional:      General: She is not in acute distress.    Appearance: She is well-developed. She is not ill-appearing or diaphoretic.  HENT:     Head: Normocephalic and atraumatic.     Right Ear: External ear normal.     Left Ear: External ear normal.     Nose: Nose normal.  Eyes:     General:        Right eye: No discharge.        Left eye: No discharge.  Cardiovascular:     Rate and Rhythm: Normal rate and regular rhythm.     Heart sounds: Normal heart sounds.  Pulmonary:      Effort: Pulmonary effort is normal.     Breath sounds: Normal breath sounds.  Abdominal:     General: There is no distension.     Palpations: Abdomen is soft.     Tenderness: There is no abdominal tenderness.  Skin:    General: Skin is warm and dry.  Neurological:     Mental Status: She is alert.  Psychiatric:        Mood and Affect: Mood is not anxious.      ED Treatments / Results  Labs (all labs ordered are listed, but only abnormal results are displayed) Labs Reviewed  COMPREHENSIVE METABOLIC PANEL - Abnormal; Notable for the following components:      Result Value   Creatinine, Ser 1.07 (*)    Calcium 8.8 (*)    GFR calc non Af Amer 52 (*)    All other components within normal limits  CBC - Abnormal; Notable for the following components:   WBC 14.7 (*)    HCT 46.1 (*)    All other components within normal limits  URINALYSIS, ROUTINE W REFLEX MICROSCOPIC - Abnormal; Notable for the following components:   APPearance HAZY (*)    Hgb urine dipstick MODERATE (*)    Leukocytes,Ua SMALL (*)    All other components within normal limits  URINALYSIS, MICROSCOPIC (REFLEX) - Abnormal; Notable for the following components:   Bacteria, UA FEW (*)    All other components within normal limits  C DIFFICILE QUICK SCREEN W PCR REFLEX  GASTROINTESTINAL PANEL BY PCR, STOOL (REPLACES STOOL CULTURE)  LIPASE, BLOOD    EKG EKG Interpretation  Date/Time:  Saturday June 12 2018 20:02:06 EST Ventricular Rate:  101 PR Interval:    QRS Duration: 100 QT Interval:  344 QTC Calculation: 446 R Axis:   -6 Text Interpretation:  Sinus tachycardia Probable left atrial enlargement Low voltage, precordial leads RSR' in V1 or V2, right VCD or RVH rate is faster, otherwise similar to May 2019 Confirmed by Sherwood Gambler [phone]) on 06/12/2018 8:10:12 PM   Radiology No results found.  Procedures Procedures (including critical care time)  Medications Ordered in ED Medications  sodium  chloride 0.9 % bolus 1,000 mL (0 mLs Intravenous Stopped 06/12/18 2151)     Initial Impression / Assessment and Plan / ED Course  I have reviewed the triage vital signs and the nursing notes.  Pertinent labs & imaging results that were available during my care of the patient were reviewed by me and considered in my medical decision making (see chart for details).     Patient feels better after IV fluids.  She is not tachycardic on my exam that was initially.  She does have an elevated WBC but it is less than prior and while she probably has infectious diarrhea, she has no signs or symptoms concerning for colitis.  No blood.  I think testing for C. difficile is warranted given antibiotic use within the last month but she is unable to produce a sample.  I do not think she needs to be treated empirically but will need to get outpatient testing with PCP.  Also GI pathogen panel.  Otherwise, plenty of fluids at home and we discussed return precautions.  Final Clinical Impressions(s) / ED Diagnoses   Final diagnoses:  Diarrhea of presumed infectious origin    ED Discharge Orders    None       Sherwood Gambler, MD 06/12/18 2205

## 2018-06-12 NOTE — ED Triage Notes (Signed)
Pt reports diarrhea x 2 weeks, 6-8x per day. Denies blood in stool. States it is not watery.

## 2018-06-14 DIAGNOSIS — R197 Diarrhea, unspecified: Secondary | ICD-10-CM | POA: Diagnosis not present

## 2018-06-21 DIAGNOSIS — R197 Diarrhea, unspecified: Secondary | ICD-10-CM | POA: Diagnosis not present

## 2018-06-21 DIAGNOSIS — A0472 Enterocolitis due to Clostridium difficile, not specified as recurrent: Secondary | ICD-10-CM | POA: Diagnosis not present

## 2018-07-13 DIAGNOSIS — M722 Plantar fascial fibromatosis: Secondary | ICD-10-CM | POA: Diagnosis not present

## 2018-11-24 DIAGNOSIS — R05 Cough: Secondary | ICD-10-CM | POA: Diagnosis not present

## 2019-01-05 DIAGNOSIS — G4733 Obstructive sleep apnea (adult) (pediatric): Secondary | ICD-10-CM | POA: Diagnosis not present

## 2019-01-05 DIAGNOSIS — G4736 Sleep related hypoventilation in conditions classified elsewhere: Secondary | ICD-10-CM | POA: Diagnosis not present

## 2019-01-27 DIAGNOSIS — M25562 Pain in left knee: Secondary | ICD-10-CM | POA: Diagnosis not present

## 2019-01-27 DIAGNOSIS — M25561 Pain in right knee: Secondary | ICD-10-CM | POA: Diagnosis not present

## 2019-02-10 DIAGNOSIS — G4733 Obstructive sleep apnea (adult) (pediatric): Secondary | ICD-10-CM | POA: Diagnosis not present

## 2019-02-10 DIAGNOSIS — G4736 Sleep related hypoventilation in conditions classified elsewhere: Secondary | ICD-10-CM | POA: Diagnosis not present

## 2019-03-16 DIAGNOSIS — H35372 Puckering of macula, left eye: Secondary | ICD-10-CM | POA: Diagnosis not present

## 2019-03-16 DIAGNOSIS — H2513 Age-related nuclear cataract, bilateral: Secondary | ICD-10-CM | POA: Diagnosis not present

## 2019-03-16 DIAGNOSIS — H0288B Meibomian gland dysfunction left eye, upper and lower eyelids: Secondary | ICD-10-CM | POA: Diagnosis not present

## 2019-03-16 DIAGNOSIS — H02831 Dermatochalasis of right upper eyelid: Secondary | ICD-10-CM | POA: Diagnosis not present

## 2019-03-16 DIAGNOSIS — H18413 Arcus senilis, bilateral: Secondary | ICD-10-CM | POA: Diagnosis not present

## 2019-03-16 DIAGNOSIS — H0288A Meibomian gland dysfunction right eye, upper and lower eyelids: Secondary | ICD-10-CM | POA: Diagnosis not present

## 2019-03-16 DIAGNOSIS — H43393 Other vitreous opacities, bilateral: Secondary | ICD-10-CM | POA: Diagnosis not present

## 2019-03-16 DIAGNOSIS — H5203 Hypermetropia, bilateral: Secondary | ICD-10-CM | POA: Diagnosis not present

## 2019-03-16 DIAGNOSIS — H524 Presbyopia: Secondary | ICD-10-CM | POA: Diagnosis not present

## 2019-03-16 DIAGNOSIS — H52203 Unspecified astigmatism, bilateral: Secondary | ICD-10-CM | POA: Diagnosis not present

## 2019-03-16 DIAGNOSIS — H02834 Dermatochalasis of left upper eyelid: Secondary | ICD-10-CM | POA: Diagnosis not present

## 2019-05-30 DIAGNOSIS — D485 Neoplasm of uncertain behavior of skin: Secondary | ICD-10-CM | POA: Diagnosis not present

## 2019-05-30 DIAGNOSIS — Z08 Encounter for follow-up examination after completed treatment for malignant neoplasm: Secondary | ICD-10-CM | POA: Diagnosis not present

## 2019-05-30 DIAGNOSIS — L820 Inflamed seborrheic keratosis: Secondary | ICD-10-CM | POA: Diagnosis not present

## 2019-05-30 DIAGNOSIS — L304 Erythema intertrigo: Secondary | ICD-10-CM | POA: Diagnosis not present

## 2019-05-30 DIAGNOSIS — Z85820 Personal history of malignant melanoma of skin: Secondary | ICD-10-CM | POA: Diagnosis not present

## 2019-06-07 DIAGNOSIS — R7301 Impaired fasting glucose: Secondary | ICD-10-CM | POA: Diagnosis not present

## 2019-06-07 DIAGNOSIS — E785 Hyperlipidemia, unspecified: Secondary | ICD-10-CM | POA: Diagnosis not present

## 2019-06-07 DIAGNOSIS — Z79899 Other long term (current) drug therapy: Secondary | ICD-10-CM | POA: Diagnosis not present

## 2019-06-23 DIAGNOSIS — Z1231 Encounter for screening mammogram for malignant neoplasm of breast: Secondary | ICD-10-CM | POA: Diagnosis not present

## 2019-07-04 DIAGNOSIS — M19042 Primary osteoarthritis, left hand: Secondary | ICD-10-CM | POA: Diagnosis not present

## 2019-07-04 DIAGNOSIS — M79645 Pain in left finger(s): Secondary | ICD-10-CM | POA: Diagnosis not present

## 2019-07-08 DIAGNOSIS — N6001 Solitary cyst of right breast: Secondary | ICD-10-CM | POA: Diagnosis not present

## 2019-07-08 DIAGNOSIS — R928 Other abnormal and inconclusive findings on diagnostic imaging of breast: Secondary | ICD-10-CM | POA: Diagnosis not present

## 2019-07-21 DIAGNOSIS — N6091 Unspecified benign mammary dysplasia of right breast: Secondary | ICD-10-CM | POA: Diagnosis not present

## 2019-07-21 DIAGNOSIS — R928 Other abnormal and inconclusive findings on diagnostic imaging of breast: Secondary | ICD-10-CM | POA: Diagnosis not present

## 2019-07-21 DIAGNOSIS — N6311 Unspecified lump in the right breast, upper outer quadrant: Secondary | ICD-10-CM | POA: Diagnosis not present

## 2019-07-21 DIAGNOSIS — D241 Benign neoplasm of right breast: Secondary | ICD-10-CM | POA: Diagnosis not present

## 2019-08-17 DIAGNOSIS — M1611 Unilateral primary osteoarthritis, right hip: Secondary | ICD-10-CM | POA: Diagnosis not present

## 2019-08-17 DIAGNOSIS — M545 Low back pain: Secondary | ICD-10-CM | POA: Diagnosis not present

## 2019-08-17 DIAGNOSIS — M25551 Pain in right hip: Secondary | ICD-10-CM | POA: Diagnosis not present

## 2019-08-24 DIAGNOSIS — M545 Low back pain: Secondary | ICD-10-CM | POA: Diagnosis not present

## 2019-08-24 DIAGNOSIS — M7061 Trochanteric bursitis, right hip: Secondary | ICD-10-CM | POA: Diagnosis not present

## 2019-09-07 DIAGNOSIS — M7061 Trochanteric bursitis, right hip: Secondary | ICD-10-CM | POA: Diagnosis not present

## 2019-09-08 DIAGNOSIS — M25551 Pain in right hip: Secondary | ICD-10-CM | POA: Diagnosis not present

## 2019-09-08 DIAGNOSIS — M6281 Muscle weakness (generalized): Secondary | ICD-10-CM | POA: Diagnosis not present

## 2019-09-08 DIAGNOSIS — M545 Low back pain: Secondary | ICD-10-CM | POA: Diagnosis not present

## 2019-09-08 DIAGNOSIS — R262 Difficulty in walking, not elsewhere classified: Secondary | ICD-10-CM | POA: Diagnosis not present

## 2019-09-14 DIAGNOSIS — M25551 Pain in right hip: Secondary | ICD-10-CM | POA: Diagnosis not present

## 2019-09-14 DIAGNOSIS — M6281 Muscle weakness (generalized): Secondary | ICD-10-CM | POA: Diagnosis not present

## 2019-09-14 DIAGNOSIS — R262 Difficulty in walking, not elsewhere classified: Secondary | ICD-10-CM | POA: Diagnosis not present

## 2019-09-14 DIAGNOSIS — M545 Low back pain: Secondary | ICD-10-CM | POA: Diagnosis not present

## 2019-09-15 DIAGNOSIS — M545 Low back pain: Secondary | ICD-10-CM | POA: Diagnosis not present

## 2019-09-15 DIAGNOSIS — M6281 Muscle weakness (generalized): Secondary | ICD-10-CM | POA: Diagnosis not present

## 2019-09-15 DIAGNOSIS — R262 Difficulty in walking, not elsewhere classified: Secondary | ICD-10-CM | POA: Diagnosis not present

## 2019-09-15 DIAGNOSIS — M25551 Pain in right hip: Secondary | ICD-10-CM | POA: Diagnosis not present

## 2019-09-19 DIAGNOSIS — M545 Low back pain: Secondary | ICD-10-CM | POA: Diagnosis not present

## 2019-09-19 DIAGNOSIS — M25551 Pain in right hip: Secondary | ICD-10-CM | POA: Diagnosis not present

## 2019-09-19 DIAGNOSIS — M6281 Muscle weakness (generalized): Secondary | ICD-10-CM | POA: Diagnosis not present

## 2019-09-19 DIAGNOSIS — R262 Difficulty in walking, not elsewhere classified: Secondary | ICD-10-CM | POA: Diagnosis not present

## 2019-09-28 DIAGNOSIS — M25551 Pain in right hip: Secondary | ICD-10-CM | POA: Diagnosis not present

## 2019-09-28 DIAGNOSIS — M545 Low back pain: Secondary | ICD-10-CM | POA: Diagnosis not present

## 2019-09-28 DIAGNOSIS — M6281 Muscle weakness (generalized): Secondary | ICD-10-CM | POA: Diagnosis not present

## 2019-09-28 DIAGNOSIS — R262 Difficulty in walking, not elsewhere classified: Secondary | ICD-10-CM | POA: Diagnosis not present

## 2019-09-29 DIAGNOSIS — R262 Difficulty in walking, not elsewhere classified: Secondary | ICD-10-CM | POA: Diagnosis not present

## 2019-09-29 DIAGNOSIS — M25551 Pain in right hip: Secondary | ICD-10-CM | POA: Diagnosis not present

## 2019-09-29 DIAGNOSIS — M6281 Muscle weakness (generalized): Secondary | ICD-10-CM | POA: Diagnosis not present

## 2019-09-29 DIAGNOSIS — M545 Low back pain: Secondary | ICD-10-CM | POA: Diagnosis not present

## 2019-11-14 DIAGNOSIS — L820 Inflamed seborrheic keratosis: Secondary | ICD-10-CM | POA: Diagnosis not present

## 2019-11-14 DIAGNOSIS — D485 Neoplasm of uncertain behavior of skin: Secondary | ICD-10-CM | POA: Diagnosis not present

## 2019-12-05 DIAGNOSIS — Z0000 Encounter for general adult medical examination without abnormal findings: Secondary | ICD-10-CM | POA: Diagnosis not present

## 2020-02-03 DIAGNOSIS — N83202 Unspecified ovarian cyst, left side: Secondary | ICD-10-CM | POA: Diagnosis not present

## 2020-02-03 DIAGNOSIS — N2889 Other specified disorders of kidney and ureter: Secondary | ICD-10-CM | POA: Diagnosis not present

## 2020-02-03 DIAGNOSIS — N281 Cyst of kidney, acquired: Secondary | ICD-10-CM | POA: Diagnosis not present

## 2020-02-14 DIAGNOSIS — U071 COVID-19: Secondary | ICD-10-CM | POA: Diagnosis not present

## 2020-02-14 DIAGNOSIS — R509 Fever, unspecified: Secondary | ICD-10-CM | POA: Diagnosis not present

## 2020-02-14 DIAGNOSIS — R051 Acute cough: Secondary | ICD-10-CM | POA: Diagnosis not present

## 2020-02-14 DIAGNOSIS — R5381 Other malaise: Secondary | ICD-10-CM | POA: Diagnosis not present

## 2020-03-09 DIAGNOSIS — R945 Abnormal results of liver function studies: Secondary | ICD-10-CM | POA: Diagnosis not present

## 2020-03-26 IMAGING — DX DG CHEST 1V PORT
1 series · 1 of 1 positions shown · non-contrast
Comparison: 07/08/2017

CLINICAL DATA: Hypoxia.  Knee replacement yesterday

EXAM:
PORTABLE CHEST 1 VIEW

[chest ap]
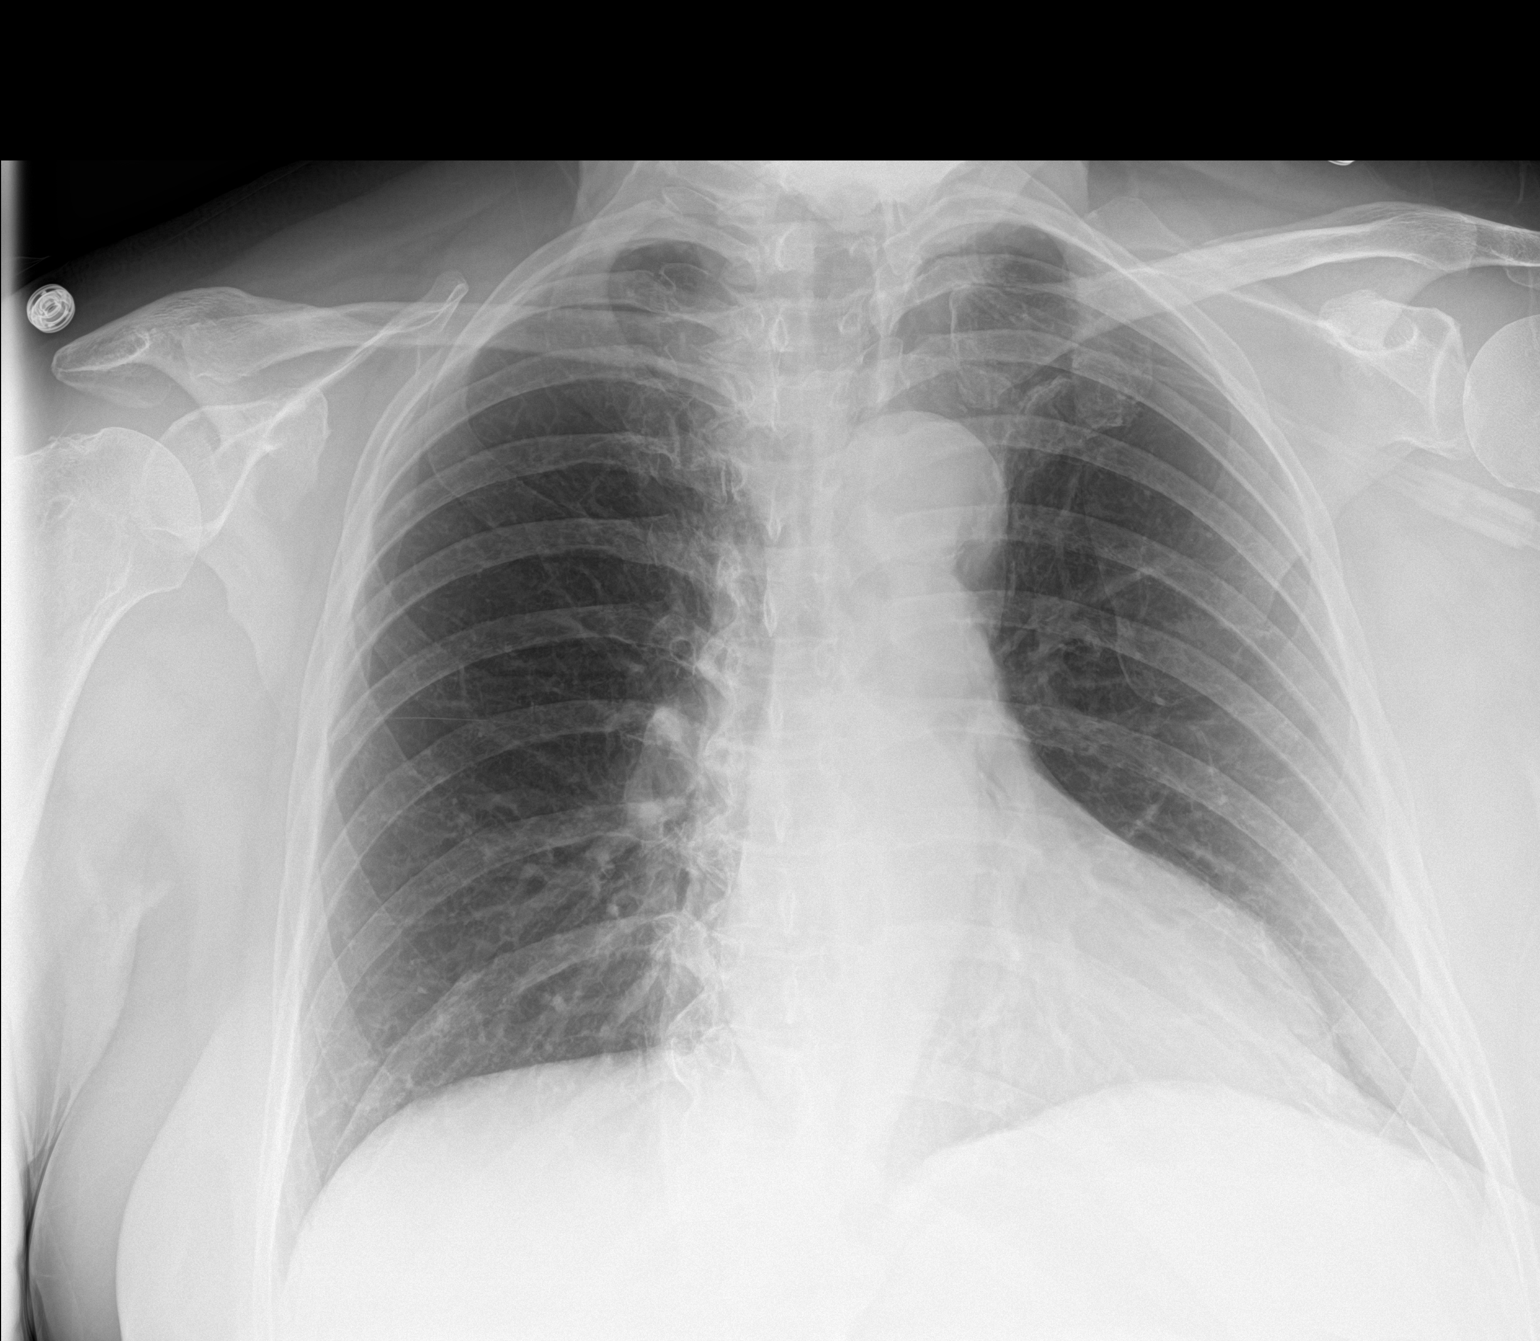

[1 of 1 positions shown; findings below may reference images not displayed]

FINDINGS: Normal heart size when accounting for rotation. Stable aortic
tortuosity and negative hila. There is no edema, consolidation,
effusion, or pneumothorax. Spondylosis.
IMPRESSION: No evidence of active disease.  Stable from prior.
